# Patient Record
Sex: Male | Born: 1970 | Race: Black or African American | Hispanic: No | Marital: Single
Health system: Southern US, Community
[De-identification: ages and names within clinical notes are randomized; demographics above are authoritative.]

## PROBLEM LIST (undated history)

## (undated) DIAGNOSIS — F32A Depression, unspecified: Secondary | ICD-10-CM

## (undated) DIAGNOSIS — F329 Major depressive disorder, single episode, unspecified: Secondary | ICD-10-CM

## (undated) DIAGNOSIS — E119 Type 2 diabetes mellitus without complications: Secondary | ICD-10-CM

## (undated) HISTORY — PX: NO PAST SURGERIES: SHX2092

---

## 1998-05-09 ENCOUNTER — Emergency Department (HOSPITAL_COMMUNITY): Admission: EM | Admit: 1998-05-09 | Discharge: 1998-05-09 | Payer: Self-pay | Admitting: Emergency Medicine

## 2005-09-30 ENCOUNTER — Emergency Department (HOSPITAL_COMMUNITY): Admission: EM | Admit: 2005-09-30 | Discharge: 2005-10-01 | Payer: Self-pay | Admitting: Emergency Medicine

## 2005-10-01 ENCOUNTER — Ambulatory Visit: Payer: Self-pay | Admitting: Psychiatry

## 2005-10-01 ENCOUNTER — Inpatient Hospital Stay (HOSPITAL_COMMUNITY): Admission: EM | Admit: 2005-10-01 | Discharge: 2005-10-01 | Payer: Self-pay | Admitting: Psychiatry

## 2010-06-02 ENCOUNTER — Emergency Department: Payer: Self-pay | Admitting: Emergency Medicine

## 2010-09-14 ENCOUNTER — Emergency Department: Payer: Self-pay | Admitting: Emergency Medicine

## 2013-06-24 ENCOUNTER — Inpatient Hospital Stay: Payer: Self-pay | Admitting: Internal Medicine

## 2013-06-24 LAB — ETHANOL
Ethanol %: <0.003 % (ref 0.000–0.080)
Ethanol: <3 mg/dL

## 2013-06-24 LAB — COMPREHENSIVE METABOLIC PANEL
Albumin: 4.3 g/dL (ref 3.4–5.0)
Alkaline Phosphatase: 84 U/L (ref 50–136)
Anion Gap: 12 (ref 7–16)
BUN: 28 mg/dL — ABNORMAL HIGH (ref 7–18)
Bilirubin,Total: 1 mg/dL (ref 0.2–1.0)
Calcium, Total: 8.9 mg/dL (ref 8.5–10.1)
Chloride: 100 mmol/L (ref 98–107)
EGFR (African American): >60
Glucose: 314 mg/dL — ABNORMAL HIGH (ref 65–99)
Osmolality: 291 (ref 275–301)
Potassium: 3.8 mmol/L (ref 3.5–5.1)
SGOT(AST): 62 U/L — ABNORMAL HIGH (ref 15–37)
Sodium: 137 mmol/L (ref 136–145)

## 2013-06-24 LAB — BASIC METABOLIC PANEL
Anion Gap: 6 — ABNORMAL LOW (ref 7–16)
BUN: 23 mg/dL — ABNORMAL HIGH (ref 7–18)
Calcium, Total: 8 mg/dL — ABNORMAL LOW (ref 8.5–10.1)
Chloride: 109 mmol/L — ABNORMAL HIGH (ref 98–107)
Co2: 27 mmol/L (ref 21–32)
Creatinine: 1.08 mg/dL (ref 0.60–1.30)
EGFR (African American): >60
Glucose: 189 mg/dL — ABNORMAL HIGH (ref 65–99)
Osmolality: 292 (ref 275–301)
Potassium: 3.8 mmol/L (ref 3.5–5.1)
Sodium: 142 mmol/L (ref 136–145)

## 2013-06-24 LAB — URINALYSIS, COMPLETE
Bacteria: NONE SEEN
Hyaline Cast: 3
Leukocyte Esterase: NEGATIVE
Nitrite: NEGATIVE
Protein: 100
RBC,UR: 11 /HPF (ref 0–5)
Specific Gravity: 1.028 (ref 1.003–1.030)
WBC UR: 4 /HPF (ref 0–5)

## 2013-06-24 LAB — CK TOTAL AND CKMB (NOT AT ARMC)
CK, Total: 1933 U/L — ABNORMAL HIGH (ref 35–232)
CK, Total: 1209 U/L — ABNORMAL HIGH (ref 35–232)
CK-MB: 17.6 ng/mL — ABNORMAL HIGH (ref 0.5–3.6)

## 2013-06-24 LAB — DRUG SCREEN, URINE
Amphetamines, Ur Screen: NEGATIVE (ref ?–1000)
Barbiturates, Ur Screen: NEGATIVE (ref ?–200)
Benzodiazepine, Ur Scrn: NEGATIVE (ref ?–200)
Cannabinoid 50 Ng, Ur ~~LOC~~: POSITIVE (ref ?–50)
MDMA (Ecstasy)Ur Screen: NEGATIVE (ref ?–500)
Methadone, Ur Screen: NEGATIVE (ref ?–300)
Opiate, Ur Screen: NEGATIVE (ref ?–300)
Phencyclidine (PCP) Ur S: NEGATIVE (ref ?–25)
Tricyclic, Ur Screen: NEGATIVE (ref ?–1000)

## 2013-06-24 LAB — CBC
MCH: 31.1 pg (ref 26.0–34.0)
MCV: 90 fL (ref 80–100)
Platelet: 297 10*3/uL (ref 150–440)
RDW: 13.3 % (ref 11.5–14.5)

## 2013-06-25 LAB — CBC WITH DIFFERENTIAL/PLATELET
Eosinophil #: 0.2 10*3/uL (ref 0.0–0.7)
HGB: 12.9 g/dL — ABNORMAL LOW (ref 13.0–18.0)
Lymphocyte #: 2.2 10*3/uL (ref 1.0–3.6)
Lymphocyte %: 32.8 %
MCH: 30.7 pg (ref 26.0–34.0)
MCHC: 34 g/dL (ref 32.0–36.0)
MCV: 90 fL (ref 80–100)
Monocyte %: 7.3 %
Neutrophil #: 3.7 10*3/uL (ref 1.4–6.5)
Neutrophil %: 55.8 %
Platelet: 223 10*3/uL (ref 150–440)
RBC: 4.18 10*6/uL — ABNORMAL LOW (ref 4.40–5.90)

## 2013-06-25 LAB — CK: CK, Total: 630 U/L — ABNORMAL HIGH (ref 35–232)

## 2013-06-25 LAB — BASIC METABOLIC PANEL
Anion Gap: 5 — ABNORMAL LOW (ref 7–16)
Co2: 27 mmol/L (ref 21–32)
EGFR (Non-African Amer.): >60
Glucose: 98 mg/dL (ref 65–99)
Osmolality: 288 (ref 275–301)
Potassium: 3.5 mmol/L (ref 3.5–5.1)
Sodium: 144 mmol/L (ref 136–145)

## 2013-07-14 ENCOUNTER — Ambulatory Visit
Admission: RE | Admit: 2013-07-14 | Discharge: 2013-07-14 | Disposition: A | Discharge status: Home / Self Care | Payer: No Typology Code available for payment source | Source: Ambulatory Visit | Attending: Student | Admitting: Student

## 2013-07-14 ENCOUNTER — Other Ambulatory Visit: Payer: Self-pay | Admitting: Student

## 2013-07-14 DIAGNOSIS — Z9289 Personal history of other medical treatment: Secondary | ICD-10-CM

## 2013-11-04 ENCOUNTER — Emergency Department: Payer: Self-pay | Admitting: Emergency Medicine

## 2013-11-04 LAB — URINALYSIS, COMPLETE
Bacteria: NONE SEEN
Glucose,UR: >=500 mg/dL (ref 0–75)
Leukocyte Esterase: NEGATIVE
Protein: 30
RBC,UR: 3 /HPF (ref 0–5)
Specific Gravity: 1.033 (ref 1.003–1.030)
WBC UR: 6 /HPF (ref 0–5)

## 2013-11-04 LAB — COMPREHENSIVE METABOLIC PANEL
Alkaline Phosphatase: 88 U/L (ref 50–136)
Anion Gap: 8 (ref 7–16)
BUN: 28 mg/dL — ABNORMAL HIGH (ref 7–18)
Calcium, Total: 9.4 mg/dL (ref 8.5–10.1)
Co2: 29 mmol/L (ref 21–32)
Creatinine: 1.37 mg/dL — ABNORMAL HIGH (ref 0.60–1.30)
EGFR (African American): >60
EGFR (Non-African Amer.): >60
Osmolality: 281 (ref 275–301)
Potassium: 3.2 mmol/L — ABNORMAL LOW (ref 3.5–5.1)
SGOT(AST): 34 U/L (ref 15–37)
SGPT (ALT): 23 U/L (ref 12–78)
Sodium: 136 mmol/L (ref 136–145)
Total Protein: 8.2 g/dL (ref 6.4–8.2)

## 2013-11-04 LAB — DRUG SCREEN, URINE
Benzodiazepine, Ur Scrn: NEGATIVE (ref ?–200)
Phencyclidine (PCP) Ur S: NEGATIVE (ref ?–25)
Tricyclic, Ur Screen: NEGATIVE (ref ?–1000)

## 2013-11-04 LAB — CBC
HCT: 46.9 % (ref 40.0–52.0)
HGB: 16.2 g/dL (ref 13.0–18.0)
MCH: 30.4 pg (ref 26.0–34.0)
MCHC: 34.6 g/dL (ref 32.0–36.0)
Platelet: 349 10*3/uL (ref 150–440)
RBC: 5.33 10*6/uL (ref 4.40–5.90)
RDW: 12.6 % (ref 11.5–14.5)
WBC: 15.6 10*3/uL — ABNORMAL HIGH (ref 3.8–10.6)

## 2013-11-04 LAB — ACETAMINOPHEN LEVEL: Acetaminophen: <2 ug/mL

## 2013-11-15 ENCOUNTER — Encounter (HOSPITAL_COMMUNITY): Payer: Self-pay | Admitting: Emergency Medicine

## 2013-11-15 ENCOUNTER — Emergency Department (HOSPITAL_COMMUNITY)
Admission: EM | Admit: 2013-11-15 | Discharge: 2013-11-15 | Disposition: A | Discharge status: Other Acute Inpatient Hospital | Payer: BC Managed Care – PPO | Attending: Emergency Medicine | Admitting: Emergency Medicine

## 2013-11-15 DIAGNOSIS — R45851 Suicidal ideations: Secondary | ICD-10-CM | POA: Insufficient documentation

## 2013-11-15 DIAGNOSIS — Z794 Long term (current) use of insulin: Secondary | ICD-10-CM | POA: Insufficient documentation

## 2013-11-15 DIAGNOSIS — E119 Type 2 diabetes mellitus without complications: Secondary | ICD-10-CM | POA: Insufficient documentation

## 2013-11-15 DIAGNOSIS — F39 Unspecified mood [affective] disorder: Secondary | ICD-10-CM | POA: Insufficient documentation

## 2013-11-15 DIAGNOSIS — F1994 Other psychoactive substance use, unspecified with psychoactive substance-induced mood disorder: Secondary | ICD-10-CM

## 2013-11-15 HISTORY — DX: Type 2 diabetes mellitus without complications: E11.9

## 2013-11-15 LAB — ACETAMINOPHEN LEVEL: Acetaminophen (Tylenol), Serum: <15 ug/mL (ref 10–30)

## 2013-11-15 LAB — CBC
HCT: 41.9 % (ref 39.0–52.0)
Hemoglobin: 14.4 g/dL (ref 13.0–17.0)
MCV: 88.6 fL (ref 78.0–100.0)
RDW: 12.9 % (ref 11.5–15.5)
WBC: 7.5 10*3/uL (ref 4.0–10.5)

## 2013-11-15 LAB — COMPREHENSIVE METABOLIC PANEL
Albumin: 3.9 g/dL (ref 3.5–5.2)
Alkaline Phosphatase: 72 U/L (ref 39–117)
BUN: 18 mg/dL (ref 6–23)
Calcium: 9.5 mg/dL (ref 8.4–10.5)
Chloride: 96 mEq/L (ref 96–112)
Creatinine, Ser: 1.07 mg/dL (ref 0.50–1.35)
GFR calc Af Amer: >90 mL/min (ref >90)
GFR calc non Af Amer: 84 mL/min — ABNORMAL LOW (ref >90)
Glucose, Bld: 150 mg/dL — ABNORMAL HIGH (ref 70–99)
Potassium: 3.8 mEq/L (ref 3.5–5.1)
Sodium: 135 mEq/L (ref 135–145)
Total Bilirubin: 0.3 mg/dL (ref 0.3–1.2)

## 2013-11-15 LAB — GLUCOSE, CAPILLARY: Glucose-Capillary: 258 mg/dL — ABNORMAL HIGH (ref 70–99)

## 2013-11-15 LAB — ETHANOL: Alcohol, Ethyl (B): <11 mg/dL (ref 0–11)

## 2013-11-15 LAB — RAPID URINE DRUG SCREEN, HOSP PERFORMED: Barbiturates: NOT DETECTED

## 2013-11-15 LAB — SALICYLATE LEVEL: Salicylate Lvl: <2 mg/dL — ABNORMAL LOW (ref 2.8–20.0)

## 2013-11-15 MED ORDER — INSULIN GLARGINE 100 UNIT/ML ~~LOC~~ SOLN
14.0000 [IU] | Freq: Every day | SUBCUTANEOUS | Status: DC
  Administered 2013-11-15: 14 [IU] via SUBCUTANEOUS
  Filled 2013-11-15: qty 0.14

## 2013-11-15 MED ORDER — HYDROXYZINE HCL 25 MG PO TABS: 25.0000 mg | ORAL_TABLET | Freq: Four times a day (QID) | ORAL | Status: DC | PRN

## 2013-11-15 MED ORDER — INSULIN ASPART 100 UNIT/ML ~~LOC~~ SOLN: 9.0000 [IU] | Freq: Every day | SUBCUTANEOUS | Status: DC

## 2013-11-15 MED ORDER — INSULIN ASPART 100 UNIT/ML ~~LOC~~ SOLN: 7.0000 [IU] | Freq: Two times a day (BID) | SUBCUTANEOUS | Status: DC

## 2013-11-15 MED ORDER — INSULIN LISPRO 100 UNIT/ML (KWIKPEN): 7.0000 [IU] | PEN_INJECTOR | Freq: Three times a day (TID) | SUBCUTANEOUS | Status: DC

## 2013-11-15 NOTE — ED Notes (Signed)
Patient cooperative. Denies HI, AVH. Reports SI. Contracts for safety. Expresses feelings of hopelessness; anxiety 9/10, depression 10+/10. States that he lost his "soulmate on 5/12" and that he has not worked in three weeks. Reports having no home but states his stepmom and dad are in his life. Patient appeared anxious. Requesting several snacks.   Given Lantus as ordered. Resting quietly.  Safety maintained, Q 15 checks continue.

## 2013-11-15 NOTE — ED Notes (Addendum)
Pt is SI. Pt states that he wants to die, has had a previous attempt in the past. States he just feels hopeless. Has no plan just wants to die. Denies pain.

## 2013-11-15 NOTE — ED Provider Notes (Signed)
CSN: [id number]     Arrival date & time 11/15/13  1833 History   First MD Initiated Contact with Patient 11/15/13 1913     Chief Complaint  Patient presents with  . Medical Clearance   (Consider location/radiation/quality/duration/timing/severity/associated sxs/prior Treatment) HPI Comments: Patient with previous history of depression presents to the ER for worsening depression with helplessness and hopelessness. Patient does report a previous suicide attempt, feels like he is getting close to attempting again. Patient reports that he has multiple stressors. He broke up with his girlfriend in May and has not gotten over her. He also has recently started using crack cocaine again. He lost his job.   Past Medical History  Diagnosis Date  . Diabetes mellitus without complication    History reviewed. No pertinent past surgical history. No family history on file. History  Substance Use Topics  . Smoking status: Unknown If Ever Smoked  . Smokeless tobacco: Not on file  . Alcohol Use: Yes    Review of Systems  Psychiatric/Behavioral: Positive for suicidal ideas and dysphoric mood.  All other systems reviewed and are negative.    Allergies  Review of patient's allergies indicates no known allergies.  Home Medications   Current Outpatient Rx  Name  Route  Sig  Dispense  Refill  . insulin glargine (LANTUS) 100 UNIT/ML injection   Subcutaneous   Inject 14 Units into the skin at bedtime.         . insulin lispro (HUMALOG KWIKPEN) 100 UNIT/ML SOPN   Subcutaneous   Inject 7-9 Units into the skin 3 (three) times daily with meals. 7 units at breakfast 7 units at lunch and 9 units at supper          BP 133/81  Pulse 65  Temp(Src) 98.7 F (37.1 C) (Oral)  Resp 18  SpO2 97% Physical Exam  Constitutional: He is oriented to person, place, and time. He appears well-developed and well-nourished. No distress.  HENT:  Head: Normocephalic and atraumatic.  Right Ear: Hearing normal.   Left Ear: Hearing normal.  Nose: Nose normal.  Mouth/Throat: Oropharynx is clear and moist and mucous membranes are normal.  Eyes: Conjunctivae and EOM are normal. Pupils are equal, round, and reactive to light.  Neck: Normal range of motion. Neck supple.  Cardiovascular: Regular rhythm, S1 normal and S2 normal.  Exam reveals no gallop and no friction rub.   No murmur heard. Pulmonary/Chest: Effort normal and breath sounds normal. No respiratory distress. He exhibits no tenderness.  Abdominal: Soft. Normal appearance and bowel sounds are normal. There is no hepatosplenomegaly. There is no tenderness. There is no rebound, no guarding, no tenderness at McBurney's point and negative Murphy's sign. No hernia.  Musculoskeletal: Normal range of motion.  Neurological: He is alert and oriented to person, place, and time. He has normal strength. No cranial nerve deficit or sensory deficit. Coordination normal. GCS eye subscore is 4. GCS verbal subscore is 5. GCS motor subscore is 6.  Skin: Skin is warm, dry and intact. No rash noted. No cyanosis.  Psychiatric: His speech is normal and behavior is normal. Thought content normal. He exhibits a depressed mood. He expresses no suicidal plans.    ED Course  Procedures (including critical care time) Labs Review Labs Reviewed  ACETAMINOPHEN LEVEL  CBC  COMPREHENSIVE METABOLIC PANEL  ETHANOL  SALICYLATE LEVEL  URINE RAPID DRUG SCREEN (HOSP PERFORMED)   Imaging Review No results found.  EKG Interpretation   None  MDM  Diagnosis: 1. Depression 2. Crack cocaine abuse  Patient with history of depression presents to the ER with worsening depression, helplessness, hopelessness and death wish. He does have previous suicide attempt and he is feeling like he is getting close to attempting once again. He does not have currently a plan, but reports that he is "working out". The patient will require inpatient hospitalization.    Gilda Crease, MD 11/15/13 [phone]

## 2013-11-15 NOTE — BH Assessment (Signed)
Assessment Note  Erik Ray is an 42 y.o. male who presents to [facility] because he was experiencing an "internal debate about ending everything and checking out"  When asked to clarify what he meant by checking out he stated, "ending it all and finding a way to die."  He reports thinking of using carbon monoxide because his research indicates that's the best way and he doesn't own a gun.  He is feeling hopeless that his life situation will improve, he has no job and just lost his girlfriend whom he thinks about "a thousand times a day" and relapsed on crack 3 weeks ago after a 90 day stay at [facility].  He reports two previous attempts by overdose, one in the late 90s and one several years later.  He denies HI or AVH or delusional thoughts.  He is unable to contract for safety.  He endorses feelings of worthlessness, hopelessness, irritability, tearfulness, increased sleep of around 22 hours per day because "when I sleep I don't feel anything."  He is appropriate for inpatient treatment for crisis stabilization.  Axis I: Major Depression, Recurrent severe and Substance Abuse Axis II: Deferred Axis III:  Past Medical History  Diagnosis Date  . Diabetes mellitus without complication    Axis IV: economic problems, housing problems, occupational problems and problems with primary support group Axis V: 31-40 impairment in reality testing  Past Medical History:  Past Medical History  Diagnosis Date  . Diabetes mellitus without complication     History reviewed. No pertinent past surgical history.  Family History: No family history on file.  Social History:  reports that he drinks alcohol. His tobacco and drug histories are not on file.  Additional Social History:  Alcohol / Drug Use History of alcohol / drug use?: Yes Longest period of sobriety (when/how long): 3 weeks Negative Consequences of Use: Financial;Personal relationships Substance #1 Name of Substance 1: Crack 1 - Age  of First Use: 20 1 - Amount (size/oz): 5 grams 1 - Frequency: daily 1 - Duration: 2.5 weeks 1 - Last Use / Amount: 12/1  CIWA: CIWA-Ar BP: 133/81 mmHg Pulse Rate: 65 COWS:    Allergies: No Known Allergies  Home Medications:  (Not in a hospital admission)  OB/GYN Status:  No LMP for male patient.  General Assessment Data Location of Assessment: [facility] ED Is this a Tele or Face-to-Face Assessment?: Face-to-Face Is this an Initial Assessment or a Re-assessment for this encounter?: Initial Assessment Living Arrangements: Alone Can pt return to current living arrangement?: Yes Admission Status: Voluntary Is patient capable of signing voluntary admission?: Yes Transfer from: Acute Hospital Referral Source: Self/Family/Friend     [facility] Crisis Care Plan Living Arrangements: Alone  Education Status Is patient currently in school?: No Highest grade of school patient has completed: bachelor's degree in business  Risk to self Suicidal Ideation: Yes-Currently Present Suicidal Intent: Yes-Currently Present Is patient at risk for suicide?: Yes Suicidal Plan?: Yes-Currently Present Specify Current Suicidal Plan: carbon monoxide Access to Means: Yes Specify Access to Suicidal Means: vehicle What has been your use of drugs/alcohol within the last 12 months?: recent relapse on crack Previous Attempts/Gestures: Yes How many times?: 2 Triggers for Past Attempts: Other personal contacts Intentional Self Injurious Behavior: None Family Suicide History: No Recent stressful life event(s): Conflict (Comment);Loss (Comment);Job Loss;Financial Problems (relapse, lost home, lost job, lost girlfriend) Persecutory voices/beliefs?: No Depression: Yes Depression Symptoms: Despondent;Tearfulness;Isolating;Fatigue;Guilt;Loss of interest in usual pleasures;Feeling worthless/self pity;Feeling angry/irritable;Insomnia Substance abuse history and/or treatment for  substance abuse?: Yes Suicide prevention  information given to non-admitted patients: Not applicable  Risk to Others Homicidal Ideation: No Thoughts of Harm to Others: No Current Homicidal Intent: No Current Homicidal Plan: No Access to Homicidal Means: No History of harm to others?: No Assessment of Violence: None Noted Does patient have access to weapons?: No Criminal Charges Pending?: No Does patient have a court date: No  Psychosis Hallucinations: None noted Delusions: None noted  Mental Status Report Appear/Hygiene:  (unremarkable) Eye Contact: Good Motor Activity: Freedom of movement Speech: Logical/coherent Level of Consciousness: Alert Mood: Depressed Affect: Appropriate to circumstance Anxiety Level: None Thought Processes: Coherent;Relevant Judgement: Impaired Orientation: Person;Place;Time;Situation Obsessive Compulsive Thoughts/Behaviors: None  Cognitive Functioning Concentration: Decreased Memory: Recent Impaired;Remote Intact IQ: Average Insight: Fair Impulse Control: Poor Appetite: Good Weight Loss: 0 Weight Gain: 0 Sleep: Increased Total Hours of Sleep: 22 Vegetative Symptoms: Staying in bed;Decreased grooming  ADLScreening [facility] Assessment Services) Patient's cognitive ability adequate to safely complete daily activities?: Yes Patient able to express need for assistance with ADLs?: Yes Independently performs ADLs?: Yes (appropriate for developmental age)  Prior Inpatient Therapy Prior Inpatient Therapy: Yes Prior Therapy Dates: 1999, mid 2000, 2014 Prior Therapy Facilty/Provider(s): [facility], [facility], [facility] Reason for Treatment: OD, OD, SA  Prior Outpatient Therapy Prior Outpatient Therapy: Yes Prior Therapy Dates: October 2014 Prior Therapy Facilty[facility]Provider(s[facility] Reason for Treatment: SA  ADL Screening (condition at time of admission) Patient's cognitive ability adequate to safely complete daily activities?: Yes Patient  able to express need for assistance with ADLs?: Yes Independently performs ADLs?: Yes (appropriate for developmental age)       Abuse/Neglect Assessment (Assessment to be complete while patient is alone) Physical Abuse: Denies Verbal Abuse: Denies Sexual Abuse: Denies Exploitation of patient/patient's resources: Denies Values / Beliefs Cultural Requests During Hospitalization: None Spiritual Requests During Hospitalization: None   Advance Directives (For Healthcare) Advance Directive: Patient does not have advance directive;Patient would not like information Nutrition Screen- [facility] Adult/[facility] Patient's home diet: Regular  Additional Information 1:1 In Past 12 Months?: No CIRT Risk: No Elopement Risk: No Does patient have medical clearance?: Yes     Disposition:  Disposition Initial Assessment Completed for this Encounter: Yes Disposition of Patient: Inpatient treatment program Type of inpatient treatment program: Adult  On Site Evaluation by:   Reviewed with Physician:    Steward Ros 11/15/2013 9:41 PM

## 2013-11-16 ENCOUNTER — Inpatient Hospital Stay (HOSPITAL_COMMUNITY)
Admission: AD | Admit: 2013-11-16 | Discharge: 2013-11-18 | DRG: 897 | Disposition: A | Discharge status: Home / Self Care | Payer: BC Managed Care – PPO | Source: Intra-hospital | Attending: Psychiatry | Admitting: Psychiatry

## 2013-11-16 ENCOUNTER — Encounter (HOSPITAL_COMMUNITY): Payer: Self-pay | Admitting: *Deleted

## 2013-11-16 DIAGNOSIS — E119 Type 2 diabetes mellitus without complications: Secondary | ICD-10-CM | POA: Diagnosis present

## 2013-11-16 DIAGNOSIS — F1420 Cocaine dependence, uncomplicated: Principal | ICD-10-CM | POA: Diagnosis present

## 2013-11-16 DIAGNOSIS — F1994 Other psychoactive substance use, unspecified with psychoactive substance-induced mood disorder: Secondary | ICD-10-CM | POA: Diagnosis present

## 2013-11-16 DIAGNOSIS — Z79899 Other long term (current) drug therapy: Secondary | ICD-10-CM

## 2013-11-16 DIAGNOSIS — R45851 Suicidal ideations: Secondary | ICD-10-CM

## 2013-11-16 DIAGNOSIS — F322 Major depressive disorder, single episode, severe without psychotic features: Secondary | ICD-10-CM

## 2013-11-16 DIAGNOSIS — F1410 Cocaine abuse, uncomplicated: Secondary | ICD-10-CM

## 2013-11-16 LAB — GLUCOSE, CAPILLARY
Glucose-Capillary: 169 mg/dL — ABNORMAL HIGH (ref 70–99)
Glucose-Capillary: 177 mg/dL — ABNORMAL HIGH (ref 70–99)
Glucose-Capillary: 146 mg/dL — ABNORMAL HIGH (ref 70–99)

## 2013-11-16 MED ORDER — INSULIN ASPART 100 UNIT/ML ~~LOC~~ SOLN
0.0000 [IU] | Freq: Every day | SUBCUTANEOUS | Status: DC
  Administered 2013-11-17: 3 [IU] via SUBCUTANEOUS

## 2013-11-16 MED ORDER — INSULIN ASPART 100 UNIT/ML ~~LOC~~ SOLN
0.0000 [IU] | Freq: Three times a day (TID) | SUBCUTANEOUS | Status: DC
  Administered 2013-11-16: 3 [IU] via SUBCUTANEOUS
  Administered 2013-11-16: 2 [IU] via SUBCUTANEOUS
  Administered 2013-11-17: 3 [IU] via SUBCUTANEOUS
  Administered 2013-11-18: 2 [IU] via SUBCUTANEOUS

## 2013-11-16 MED ORDER — INSULIN GLARGINE 100 UNIT/ML ~~LOC~~ SOLN
14.0000 [IU] | Freq: Every day | SUBCUTANEOUS | Status: DC
  Administered 2013-11-16 – 2013-11-17 (×2): 14 [IU] via SUBCUTANEOUS

## 2013-11-16 MED ORDER — TRAZODONE HCL 50 MG PO TABS
50.0000 mg | ORAL_TABLET | Freq: Every evening | ORAL | Status: DC | PRN
  Administered 2013-11-16 – 2013-11-17 (×2): 50 mg via ORAL
  Filled 2013-11-16: qty 28
  Filled 2013-11-16 (×2): qty 1
  Filled 2013-11-16: qty 28
  Filled 2013-11-16 (×4): qty 1

## 2013-11-16 MED ORDER — MAGNESIUM HYDROXIDE 400 MG/5ML PO SUSP: 30.0000 mL | Freq: Every day | ORAL | Status: DC | PRN

## 2013-11-16 MED ORDER — ACETAMINOPHEN 325 MG PO TABS: 650.0000 mg | ORAL_TABLET | Freq: Four times a day (QID) | ORAL | Status: DC | PRN

## 2013-11-16 MED ORDER — NICOTINE POLACRILEX 2 MG MT GUM
2.0000 mg | CHEWING_GUM | OROMUCOSAL | Status: DC | PRN
  Administered 2013-11-17: 2 mg via ORAL
  Filled 2013-11-16 (×2): qty 1

## 2013-11-16 MED ORDER — ALUM & MAG HYDROXIDE-SIMETH 200-200-20 MG/5ML PO SUSP: 30.0000 mL | ORAL | Status: DC | PRN

## 2013-11-16 NOTE — Tx Team (Signed)
Interdisciplinary Treatment Plan Update (Adult)  Date: 11/16/2013   Time Reviewed: 10:40 AM  Progress in Treatment:  Attending groups: No.  Participating in groups:  No.  Taking medication as prescribed: Yes  Tolerating medication: Yes  Family/Significant othe contact made: Not yet. SPE required for this pt.  Patient understands diagnosis: Yes, AEB seeking treatment for crack cocaine abuse, SI with plan, and mood stabilization.  Discussing patient identified problems/goals with staff: Yes  Medical problems stabilized or resolved: Yes  Denies suicidal/homicidal ideation: passive SI; able to contract for safety on the unit.  Patient has not harmed self or Others: Yes  New problem(s) identified:  Discharge Plan or Barriers: Pt currently not attending d/c planning group. CSW assessing for appropriate referrals.  Additional comments:Erik Ray is an 42 y.o. male who presents to [facility] because he was experiencing an "internal debate about ending everything and checking out" When asked to clarify what he meant by checking out he stated, "ending it all and finding a way to die." He reports thinking of using carbon monoxide because his research indicates that's the best way and he doesn't own a gun. He is feeling hopeless that his life situation will improve, he has no job and just lost his girlfriend whom he thinks about "a thousand times a day" and relapsed on crack 3 weeks ago after a 90 day stay at [facility]. He reports two previous attempts by overdose, one in the late 90s and one several years later. He denies HI or AVH or delusional thoughts. He is unable to contract for safety. He endorses feelings of worthlessness, hopelessness, irritability, tearfulness, increased sleep of around 22 hours per day because "when I sleep I don't feel anything." He is appropriate for inpatient treatment for crisis stabilization. Reason for Continuation of Hospitalization: Mood  stabilization SI Medication management  Estimated length of stay: 4-6 days  For review of initial/current patient goals, please see plan of care.  Attendees:  Patient:    Family:    Physician: Geoffery Lyons MD 11/16/2013 10:40 AM   Nursing: Maureen Ralphs RN 11/16/2013 10:40 AM   Clinical Social Worker Zurri Rudden Smart, LCSWA  11/16/2013 10:40 AM   Other: Darden Dates Nurse CM  11/16/2013 10:40 AM   Other:    Other:    Other:    Scribe for Treatment Team:  Trula Slade LCSWA  11/16/2013 10:40 AM

## 2013-11-16 NOTE — Progress Notes (Signed)
Adult Psychoeducational Group Note  Date:  11/16/2013 Time:  9:56 PM  Pt did not attend NA meeting. When encouraged by writer he stated that he would rather stay sleeping  Halimah Bewick 11/16/2013, 9:56 PM

## 2013-11-16 NOTE — Progress Notes (Signed)
NUTRITION ASSESSMENT  Pt identified as at risk on the Malnutrition Screen Tool  INTERVENTION: 1. Educated patient on the importance of nutrition and encouraged intake of food and beverages.  NUTRITION DIAGNOSIS: Unintentional weight loss related to sub-optimal intake as evidenced by pt report.   Goal: Pt to meet >/= 90% of their estimated nutrition needs.  Monitor:  PO intake  Assessment:  Patient admitted with MDD, crack cocaine use, and SI.  Hx includes DM.  Patient lost job.  Patient reports UBW of 160 lbs with weight loss secondary to smoking crack.  Currently eating well with no complaints.  Patient reports following diabetic diet and has no questions at this time.    42 y.o. male  Height: Ht Readings from Last 1 Encounters:  11/16/13 5\' 6"  (1.676 m)    Weight: Wt Readings from Last 1 Encounters:  11/16/13 143 lb 8 oz (65.091 kg)    Weight Hx: Wt Readings from Last 10 Encounters:  11/16/13 143 lb 8 oz (65.091 kg)    BMI:  Body mass index is 23.17 kg/(m^2). Pt meets criteria for wnl based on current BMI.  Estimated Nutritional Needs: Kcal: 25-30 kcal/kg Protein: > 1 gram protein/kg Fluid: 1 ml/kcal  Diet Order: General Pt is also offered choice of unit snacks mid-morning and mid-afternoon.  Pt is eating as desired.   Lab results and medications reviewed.   Oran Rein, RD, LDN Clinical Inpatient Dietitian Pager:  [phone] Weekend and after hours pager:  [phone]

## 2013-11-16 NOTE — BHH Group Notes (Signed)
[facility] LCSW Aftercare Discharge Planning Group Note   11/16/2013 10:01 AM  Participation Quality:  DID NOT ATTEND    Smart, Lebron Quam

## 2013-11-16 NOTE — BHH Group Notes (Signed)
[facility] LCSW Group Therapy  11/16/2013 3:26 PM  Type of Therapy:  Group Therapy  Participation Level:  Did Not Attend  SmartLebron Quam 11/16/2013, 3:26 PM

## 2013-11-16 NOTE — BHH Suicide Risk Assessment (Signed)
Suicide Risk Assessment  Admission Assessment     Nursing information obtained from:  Patient Demographic factors:  Male;Caucasian;Low socioeconomic status;Living alone;Unemployed Current Mental Status:  NA Loss Factors:  Loss of significant relationship;Financial problems / change in socioeconomic status Historical Factors:  Prior suicide attempts;Impulsivity Risk Reduction Factors:  Responsible for children under 42 years of age;Sense of responsibility to family;Religious beliefs about death  CLINICAL FACTORS:   Depression:   Anhedonia Comorbid alcohol abuse/dependence Hopelessness Insomnia Severe Alcohol/Substance Abuse/Dependencies  COGNITIVE FEATURES THAT CONTRIBUTE TO RISK:  Closed-mindedness Polarized thinking Thought constriction (tunnel vision)    SUICIDE RISK:   Moderate:  Frequent suicidal ideation with limited intensity, and duration, some specificity in terms of plans, no associated intent, good self-control, limited dysphoria/symptomatology, some risk factors present, and identifiable protective factors, including available and accessible social support.  PLAN OF CARE: Supportive approach/coping skills/relapse prevention                               Reassess and address the co morbidities  I certify that inpatient services furnished can reasonably be expected to improve the patient's condition.  Darrin Apodaca A 11/16/2013, 12:44 PM
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
## 2013-11-16 NOTE — Tx Team (Signed)
Initial Interdisciplinary Treatment Plan  PATIENT STRENGTHS: (choose at least two) Ability for insight Active sense of humor Average or above average intelligence Capable of independent living Communication skills General fund of knowledge Physical Health Religious Affiliation Work skills  PATIENT STRESSORS: Financial difficulties Loss of girlfriend in May 2012 Substance abuse   PROBLEM LIST: Problem List/Patient Goals Date to be addressed Date deferred Reason deferred Estimated date of resolution  "Give me hope" 11/16/13     Depression 11/16/13     Substance abuse 11/16/13     Increased risk for suicide 11/16/13                                    DISCHARGE CRITERIA:  Ability to meet basic life and health needs Adequate post-discharge living arrangements Improved stabilization in mood, thinking, and/or behavior Motivation to continue treatment in a less acute level of care Need for constant or close observation no longer present Reduction of life-threatening or endangering symptoms to within safe limits Safe-care adequate arrangements made Verbal commitment to aftercare and medication compliance Withdrawal symptoms are absent or subacute and managed without 24-hour nursing intervention  PRELIMINARY DISCHARGE PLAN: Attend 12-step recovery group Outpatient therapy Participate in family therapy Placement in alternative living arrangements  PATIENT/FAMIILY INVOLVEMENT: This treatment plan has been presented to and reviewed with the patient, Erik Ray, and/or family member.  The patient and family have been given the opportunity to ask questions and make suggestions.  Erik Ray 11/16/2013, 12:54 AM

## 2013-11-16 NOTE — Progress Notes (Signed)
Patient ID: Erik Ray, male   DOB: 1971-05-26, 42 y.o.   MRN: [id number]   Pt was cooperative,but anxious and slightly irritable during the assessment. Pt didn't take the time to read any of his paperwork and when the writer explained he repeated, "I've been to places like this before". Pt was sarcastic and short with the writer, often laughing when informed of policy. When asked the circumstances surrounding his adm pt stated "I wanna keep from blowing my head off". Pt reported that he came because of his step mother. Pt's girlfriend died in 2013/05/17. When asked if he had any questions or concerns , pt stated "can you get me some cocaine". Pt wouldn't allow his blood sugar to be taken, stating that it doesn't get taken every two hours. Pt denied SI, HI, and A/v.
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
## 2013-11-17 DIAGNOSIS — F39 Unspecified mood [affective] disorder: Secondary | ICD-10-CM

## 2013-11-17 LAB — GLUCOSE, CAPILLARY
Glucose-Capillary: 88 mg/dL (ref 70–99)
Glucose-Capillary: 199 mg/dL — ABNORMAL HIGH (ref 70–99)

## 2013-11-17 NOTE — Progress Notes (Addendum)
Patient ID: Erik Ray, male   DOB: [birth year], 42 y.o.   MRN: [id number] Pt did not attend the 10am AA meeting. He was asleep in bed.

## 2013-11-17 NOTE — Progress Notes (Signed)
Patient did not attend the evening karaoke group. Pt returned to his room and went to bed as group was beginning. Pt was upset that he had no other option than to attend karaoke. Pt was explained to that it was a group like all others and all pts were encouraged to attend.

## 2013-11-17 NOTE — Progress Notes (Signed)
Recreation Therapy Notes  Date: 12.04.2014 Time: 3:00pm Location: [address] Dayroom   Group Topic: Communication, Team Building, Problem Solving  Goal Area(s) Addresses:  Patient will effectively work with peer towards shared goal.  Patient will identify skill used to make activity successful.  Patient will identify how skills used during activity can be used to reach post d/c goals.   Behavioral Response: Did not attend   Erik Ray L Erik Ray, LRT/CTRS  Gregary Blackard L 11/17/2013 3:52 PM 

## 2013-11-17 NOTE — Progress Notes (Signed)
D:  Patient up much of the evening.  Active in the milieu.  Up in dayroom, but did not attend Karaoke this evening.  Appetite is good.  He denies thoughts of suicide at this time.  A:  Medications given as ordered.  Encouraged patient to be up and attending groups.  Educated about medications.   R:  Pleasant and cooperative with staff and peers.  No obvious signs or symptoms of withdrawal noted at this time.  Safety is maintained.

## 2013-11-17 NOTE — Progress Notes (Signed)
[facility] MD Progress Note  11/17/2013 5:22 PM Erik Ray  MRN:  [id number] Subjective:  Isolating. States he wants to go to the [facility]. His family is willing to take him there. He has been avoidant, evidencing mood instability, episodes of irritability. He sates he wants to get his life back together, and be completely off cocaine Diagnosis:   DSM5: Schizophrenia Disorders:  none Obsessive-Compulsive Disorders:  none Trauma-Stressor Disorders:  none Substance/Addictive Disorders:  Cocaine related disorders Depressive Disorders:  none  Axis I: Mood Disorder NOS and Substance Induced Mood Disorder  ADL's:  Intact  Sleep: Fair  Appetite:  Fair  Suicidal Ideation:  Plan:  denies Intent:  denies Means:  denies Homicidal Ideation:  Plan:  denies Intent:  denies Means:  denies AEB (as evidenced by):  Psychiatric Specialty Exam: Review of Systems  Constitutional: Negative.   HENT: Negative.   Eyes: Negative.   Respiratory: Negative.   Cardiovascular: Negative.   Gastrointestinal: Negative.   Genitourinary: Negative.   Musculoskeletal: Negative.   Skin: Negative.   Neurological: Negative.   Endo/Heme/Allergies: Negative.   Psychiatric/Behavioral: Positive for substance abuse. The patient is nervous/anxious.     Blood pressure 108/70, pulse 63, temperature 97.7 F (36.5 C), temperature source Oral, resp. rate 16, height 5\' 6"  (1.676 m), weight 65.091 kg (143 lb 8 oz).Body mass index is 23.17 kg/(m^2).  General Appearance: Disheveled  Eye Solicitor::  Fair  Speech:  Clear and Coherent  Volume:  Decreased  Mood:  Anxious and Irritable  Affect:  Restricted  Thought Process:  Coherent and Goal Directed  Orientation:  Full (Time, Place, and Person)  Thought Content:  worries, concerns  Suicidal Thoughts:  No  Homicidal Thoughts:  No  Memory:  Immediate;   Fair Recent;   Fair Remote;   Fair  Judgement:  Fair  Insight:  Present and superficial  Psychomotor  Activity:  Restlessness  Concentration:  Fair  Recall:  Fair  Akathisia:  NA  Handed:    AIMS (if indicated):     Assets:  Desire for Improvement  Sleep:  Number of Hours: 6   Current Medications: Current Facility-Administered Medications  Medication Dose Route Frequency Provider Last Rate Last Dose  . acetaminophen (TYLENOL) tablet 650 mg  650 mg Oral Q6H PRN Kerry Hough, PA-C      . alum & mag hydroxide-simeth (MAALOX/MYLANTA) 200-200-20 MG/5ML suspension 30 mL  30 mL Oral Q4H PRN Kerry Hough, PA-C      . insulin aspart (novoLOG) injection 0-15 Units  0-15 Units Subcutaneous TID WC Kerry Hough, PA-C   3 Units at 11/17/13 1717  . insulin aspart (novoLOG) injection 0-5 Units  0-5 Units Subcutaneous QHS Spencer E Simon, PA-C      . insulin glargine (LANTUS) injection 14 Units  14 Units Subcutaneous QHS Kerry Hough, PA-C   14 Units at 11/16/13 2310  . magnesium hydroxide (MILK OF MAGNESIA) suspension 30 mL  30 mL Oral Daily PRN Kerry Hough, PA-C      . nicotine polacrilex (NICORETTE) gum 2 mg  2 mg Oral PRN Rachael Fee, MD   2 mg at 11/17/13 1509  . traZODone (DESYREL) tablet 50 mg  50 mg Oral QHS,MR X 1 Kerry Hough, PA-C   50 mg at 11/16/13 2313    Lab Results:  Results for orders placed during the hospital encounter of 11/16/13 (from the past 48 hour(s))  GLUCOSE, CAPILLARY     Status: Abnormal  Collection Time    11/16/13  6:15 AM      Result Value Range   Glucose-Capillary 148 (*) 70 - 99 mg/dL  HEMOGLOBIN Z6X     Status: Abnormal   Collection Time    11/16/13  6:40 AM      Result Value Range   Hemoglobin A1C 6.4 (*) <5.7 %   Comment: (NOTE)                                                                               According to the ADA Clinical Practice Recommendations for 2011, when     HbA1c is used as a screening test:      >=6.5%   Diagnostic of Diabetes Mellitus               (if abnormal result is confirmed)     5.7-6.4%   Increased risk  of developing Diabetes Mellitus     References:Diagnosis and Classification of Diabetes Mellitus,Diabetes     Care,2011,34(Suppl 1):S62-S69 and Standards of Medical Care in             Diabetes - 2011,Diabetes Care,2011,34 (Suppl 1):S11-S61.   Mean Plasma Glucose 137 (*) <117 mg/dL   Comment: Performed at [facility]  GLUCOSE, CAPILLARY     Status: Abnormal   Collection Time    11/16/13 11:56 AM      Result Value Range   Glucose-Capillary 169 (*) 70 - 99 mg/dL   Comment 1 Notify RN    GLUCOSE, CAPILLARY     Status: Abnormal   Collection Time    11/16/13  5:07 PM      Result Value Range   Glucose-Capillary 177 (*) 70 - 99 mg/dL  GLUCOSE, CAPILLARY     Status: Abnormal   Collection Time    11/16/13  8:55 PM      Result Value Range   Glucose-Capillary 146 (*) 70 - 99 mg/dL   Comment 1 Notify RN    GLUCOSE, CAPILLARY     Status: None   Collection Time    11/17/13  6:23 AM      Result Value Range   Glucose-Capillary 88  70 - 99 mg/dL   Comment 1 Notify RN    GLUCOSE, CAPILLARY     Status: Abnormal   Collection Time    11/17/13 11:57 AM      Result Value Range   Glucose-Capillary 104 (*) 70 - 99 mg/dL  GLUCOSE, CAPILLARY     Status: Abnormal   Collection Time    11/17/13  5:14 PM      Result Value Range   Glucose-Capillary 199 (*) 70 - 99 mg/dL    Physical Findings: AIMS: Facial and Oral Movements Muscles of Facial Expression: None, normal Lips and Perioral Area: None, normal Jaw: None, normal Tongue: None, normal,Extremity Movements Upper (arms, wrists, hands, fingers): None, normal Lower (legs, knees, ankles, toes): None, normal, Trunk Movements Neck, shoulders, hips: None, normal, Overall Severity Severity of abnormal movements (highest score from questions above): None, normal Incapacitation due to abnormal movements: None, normal Patient's awareness of abnormal movements (rate only patient's report): No Awareness, Dental Status Current problems with teeth  and/or  dentures?: No Does patient usually wear dentures?: No  CIWA:  CIWA-Ar Total: 5 COWS:     Treatment Plan Summary: Daily contact with patient to assess and evaluate symptoms and progress in treatment Medication management  Plan: Supportive approach/coping skills/relapse prevention           Reassess co morbidities           Consider a mood stabilizer  Medical Decision Making Problem Points:  Review of psycho-social stressors (1) Data Points:  Review of medication regiment & side effects (2)  I certify that inpatient services furnished can reasonably be expected to improve the patient's condition.   Kentavious Michele A 11/17/2013, 5:22 PM

## 2013-11-17 NOTE — BHH Counselor (Signed)
Adult Comprehensive Assessment  Patient ID: Erik Ray, male   DOB: [birth year], 42 y.o.   MRN: [id number]  Information Source: Information source: Patient  Current Stressors:  Educational / Learning stressors: B.S. in business from [employer] in [place], [place] Employment / Job issues: unemployed as of three weeks ago. 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 Family Relationships: strained relationship with father. close to stepmother. Close to 58 year old daughter. Mother deceased. Recent breakup with girlfriend. Financial / Lack of resources (include bankruptcy): no income/no insurance. assistance from family Housing / Lack of housing: My dad's house. They will help me get to [facility].  Physical health (include injuries & life threatening diseases): none identified Social relationships: limited-Few close friends/strained family relationships due to recent relapse. Substance abuse: relapsed on crack cocaine three weeks ago-had four months clean. No specific daily amount specified by pt. No other drug or alcohol use reported by pt. Bereavement / Loss: none identified-deaths. Mother passed away 21 years ago. relationship with girlfriend ended "recently."   Living/Environment/Situation:  Living Arrangements: Parent Living conditions (as described by patient or guardian): Good, supportive, loving How long has patient lived in current situation?: few months  What is atmosphere in current home: Comfortable;Loving;Supportive  Family History:  Marital status: Single Does patient have children?: Yes How many children?: 1 How is patient's relationship with their children?: 8 year old child. "we are pretty close."   Childhood History:  By whom was/is the patient raised?: Both parents Additional childhood history information: My mom died 21 years ago. They were married throughout my childhood. It was a good childhood. Description of  patient's relationship with caregiver when they were a child: "close to mother and father as a child (equally)" Patient's description of current relationship with people who raised him/her: mother deceased; strained relationship with father and stepmother since relapse. "my stepmom is very supportive and pushed me to get help."  Does patient have siblings?: No Did patient suffer any verbal/emotional/physical/sexual abuse as a child?: No Did patient suffer from severe childhood neglect?: No Has patient ever been sexually abused/assaulted/raped as an adolescent or adult?: No Was the patient ever a victim of a crime or a disaster?: No Witnessed domestic violence?: No Has patient been effected by domestic violence as an adult?: No  Education:  Highest grade of school patient has completed: B.S. in Busini[employer]  Currently a student?: No Learning disability?: No  Employment/Work Situation:   Employment situation: Unemployed Patient's job has been impacted by current illness: Yes Describe how patient's job has been impacted: I relapsed on crack cocaine three weeks ago until relapse. My dad might let me start back after I go to treatment.  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  Has patient ever been in the [employer]?: No Has patient ever served in combat?: No  Financial Resources:   Financial resources: Support from parents / caregiver Does patient have a Lawyer or guardian?: No  Alcohol/Substance Abuse:   What has been your use of drugs/alcohol within the last 12 months?: "I don't know how much I've used daily" Crack cocaine relapse three weeks ago after four months of clean time. No other drug or alcohol abuse identified.  If attempted suicide, did drugs/alcohol play a role in this?: No (no thoughts or past attempts reported by pt.  )  Alcohol/Substance Abuse Treatment Hx: Past Tx, Inpatient;Past Tx, Outpatient If yes, describe treatment: [facility] inpatient 2014; currently [facility].  Has alcohol/substance abuse ever caused legal problems?: No  Social Support System:   Patient's Community Support System: Fair Describe Community Support System: I have some close friends but not many.  Type of faith/religion: n/a  How does patient's faith help to cope with current illness?: n/a   Leisure/Recreation:   Leisure and Hobbies: "I dont' have any hobbies."  Strengths/Needs:   What things does the patient do well?: "I can't think of anything right now."  In what areas does patient struggle / problems for patient: coping with stress; my addiction; family relationships.   Discharge Plan:   Does patient have access to transportation?: Yes (stepmom) Will patient be returning to same living situation after discharge?: Yes (home with family until he is accepted into [facility]. ) Currently receiving community mental health services: No If no, would patient like referral for services when discharged?: No (Pt refusing to sign consent for [facility] and states that he will arrange his own follow-up. Pt refusing referral for services by CSW.) Does patient have financial barriers related to discharge medications?: Yes Patient description of barriers related to discharge medications: no insurance at this time.   Summary/Recommendations:    Pt is 42 year old male living in [place], [place] with his father and stepmother. Pt presents to [facility] for substance abuse (crack cocaine), SI with plan, and mood stabilization. Pt stated that he had four months clean but assessment note indicates that he left [facility] in mid October and relapsed two weeks later. Pt poor historian and labile in mood/refusing to answer some questions or provide detail. Pt  Denies SI/HI/AVH at this time. Recommendations for pt include:  crisis stabilization, therapeutic milieu, encourage group attendance and participation, medication management for mood stabilization, and development of comprehensive mental wellness/sobriety plan. Pt currently refusing to sign consents (roi) or release for [facility]. Pt stated that he can work on his aftercare plan himself and is refusing all assistance from CSW at this time. SPE completed with pt as he refused family contact.   Smart, Macdona LCSWA 11/17/2013

## 2013-11-17 NOTE — BHH Group Notes (Signed)
BHH LCSW Group Therapy  11/17/2013 1:15 PM   Type of Therapy:  Group Therapy  Participation Level:  Did Not Attend - pt was asleep in their room for the duration of group   Reyes Ivan, LCSW 11/17/2013 2:12 PM

## 2013-11-17 NOTE — Progress Notes (Signed)
B[facility] Group Notes:  (Nursing/MHT/Case Management/Adjunct)  Date:  11/17/2013  Time:  9:46 AM  Type of Therapy:  Nurse Education  Participation Level:  Did Not Attend  Summary of Progress/Problems: Pt was encouraged to come to group but pt stayed in bed.   Cathlean Cower 11/17/2013, 9:46 AM

## 2013-11-17 NOTE — Progress Notes (Signed)
D: Patient resting in bed with eyes closed.  Respirations even and unlabored.  Patient appears to be in no apparent distress. A: Staff to monitor Q 15 mins for safety.   R:Patient remains safe on the unit.  

## 2013-11-17 NOTE — BHH Suicide Risk Assessment (Signed)
BHH INPATIENT:  Family/Significant Other Suicide Prevention Education  Suicide Prevention Education:  Patient Refusal for Family/Significant Other Suicide Prevention Education: The patient Erik Ray has refused to provide written consent for family/significant other to be provided Family/Significant Other Suicide Prevention Education during admission and/or prior to discharge.  Physician notified.   SPE completed with pt. SPI pamphlet provided to pt and he was encouraged to share information with his support network, ask questions, and talk about any concerns.   Smart, Zykera Abella LCSWA  11/17/2013, 11:25 AM

## 2013-11-18 DIAGNOSIS — F1420 Cocaine dependence, uncomplicated: Principal | ICD-10-CM

## 2013-11-18 LAB — GLUCOSE, CAPILLARY: Glucose-Capillary: 112 mg/dL — ABNORMAL HIGH (ref 70–99)

## 2013-11-18 MED ORDER — INSULIN LISPRO 100 UNIT/ML (KWIKPEN): 7.0000 [IU] | PEN_INJECTOR | Freq: Three times a day (TID) | SUBCUTANEOUS | Status: DC

## 2013-11-18 MED ORDER — INSULIN GLARGINE 100 UNIT/ML ~~LOC~~ SOLN: 14.0000 [IU] | Freq: Every day | SUBCUTANEOUS | Status: DC

## 2013-11-18 MED ORDER — TRAZODONE HCL 50 MG PO TABS: 50.0000 mg | ORAL_TABLET | Freq: Every evening | ORAL | Status: DC | PRN

## 2013-11-18 NOTE — BHH Suicide Risk Assessment (Signed)
Suicide Risk Assessment  Discharge Assessment     Demographic Factors:  Male and Caucasian  Mental Status Per Nursing Assessment::   On Admission:  NA  Current Mental Status by Physician: In full contact with reality. There are no suicidal ideas, plans or intent. His mood is euthymic, his affect is appropriate. He is going to be picked up by family and will go to the [facility] residential treatment program. States that what went wrong the last time is that he got back with people he should not have been with.  Wants to regain his sobriety back   Loss Factors: NA  Historical Factors: NA  Risk Reduction Factors:   Employed and Positive social support  Continued Clinical Symptoms:  Alcohol/Substance Abuse/Dependencies  Cognitive Features That Contribute To Risk:  Closed-mindedness Polarized thinking Thought constriction (tunnel vision)    Suicide Risk:  Minimal: No identifiable suicidal ideation.  Patients presenting with no risk factors but with morbid ruminations; may be classified as minimal risk based on the severity of the depressive symptoms  Discharge Diagnoses:   AXIS I:  Cocaine Dependence, Substance Induced Mood Disorder AXIS II:  Deferred AXIS III:   Past Medical History  Diagnosis Date  . Diabetes mellitus without complication    AXIS IV:  other psychosocial or environmental problems AXIS V:  61-70 mild symptoms  Plan Of Care/Follow-up recommendations:  Activity:  as tolerated Diet:  regular Follow up [facility] Is patient on multiple antipsychotic therapies at discharge:  No   Has Patient had three or more failed trials of antipsychotic monotherapy by history:  No  Recommended Plan for Multiple Antipsychotic Therapies: NA  Erik Ray A 11/18/2013, 12:26 PM

## 2013-11-18 NOTE — Tx Team (Signed)
Interdisciplinary Treatment Plan Update (Adult)  Date: 11/18/2013  Time Reviewed:  9:45 AM  Progress in Treatment: Attending groups: Yes Participating in groups:  Yes Taking medication as prescribed:  Yes Tolerating medication:  Yes Family/Significant othe contact made: Yes Patient understands diagnosis:  Yes Discussing patient identified problems/goals with staff:  Yes Medical problems stabilized or resolved:  Yes Denies suicidal/homicidal ideation: Yes Issues/concerns per patient self-inventory:  Yes Other:  New problem(s) identified: N/A  Discharge Plan or Barriers: Pt refused CSW to schedule follow up and states that he will arrange his own follow up.    Reason for Continuation of Hospitalization: Stable to d/c today  Comments: N/A  Estimated length of stay: D/C today  For review of initial/current patient goals, please see plan of care.  Attendees: Patient:     Family:     Physician:  Dr. Dub Mikes 11/18/2013 9:55 AM   Nursing:   Enzo Bi, RN 11/18/2013 9:55 AM   Clinical Social Worker:  Reyes Ivan, LCSW 11/18/2013 9:55 AM   Other: Onnie Boer, RN case manager 11/18/2013 9:55 AM   Other:  Trula Slade, LCSWA 11/18/2013 9:55 AM   Other:     Other:     Other:    Other:    Other:    Other:    Other:    Other:     Scribe for Treatment Team:   Carmina Miller, 11/18/2013 , 9:55 AM

## 2013-11-18 NOTE — BHH Group Notes (Signed)
BHH LCSW Aftercare Discharge Planning Group Note   11/18/2013  8:45 AM  Participation Quality:  Did Not Attend - pt sleeping in his room  Saagar Tortorella Horton, LCSW 11/18/2013 10:33 AM       

## 2013-11-18 NOTE — Discharge Summary (Signed)
Physician Discharge Summary Note  Patient:  Erik Ray is an 42 y.o., male MRN:  [id number] DOB:  [birth year] Patient phone:  [phone] (home)  Patient address:   [address] [place] [address],   Date of Admission:  11/16/2013 Date of Discharge: 11/18/13  Reason for Admission: Cocaine dependence  Discharge Diagnoses: Active Problems:   Substance induced mood disorder   Cocaine dependence  Review of Systems  Constitutional: Negative.   HENT: Negative.   Eyes: Negative.   Respiratory: Negative.   Cardiovascular: Negative.   Gastrointestinal: Negative.   Genitourinary: Negative.   Musculoskeletal: Negative.   Skin: Negative.   Neurological: Negative.   Endo/Heme/Allergies: Negative.   Psychiatric/Behavioral: Positive for substance abuse (Cocaine dependence). Negative for depression, suicidal ideas, hallucinations and memory loss. The patient has insomnia (Stabilized with medication prior to discharge). The patient is not nervous/anxious.    DSM5: Schizophrenia Disorders:  NA Obsessive-Compulsive Disorders:  NA Trauma-Stressor Disorders:  NA Substance/Addictive Disorders:  Cocaine dependence Depressive Disorders:  Substance induced mood disorder  Axis Diagnosis:  AXIS I:  Cocaine dependence, Substance induced mood disorder AXIS II:  Deferred AXIS III:   Past Medical History  Diagnosis Date  . Diabetes mellitus without complication    AXIS IV:  other psychosocial or environmental problems and Cocaine dependence AXIS V:  63  Level of Care:  OP  Hospital Course: 42 Y/O male who came requesting help after he relapsed on crack cocaine. He has been using on and off since he was 42 Y/O. More recently he wen to [facility] for 90 days extended stay. He got out Oct 17 and relapsed on crack 2 weeks later. Has been using every day. Admits to depression with plans to kill himself by using carbon monoxide. States he has lost everything.  While a patient in this  hospital and after admission assessment/evaluation, it was determined based on patient's symptoms that he will need medication management to stabilize her current major depressive mood symptoms. His UDS was only positive for cocaine. As it known that there have not been any established detoxification treatment protocols for cocaine. Therefore, Mr. Nanna did not receive any detoxification treatment protocols. However, he was ordered and received Trazodone 50 mg Q bedtime for sleep. He also was enrolled in group counseling sessions and activities where he was taught, counseled and learned coping skills that should help him cope better and manage his symptoms effectively after discharge. He also received medication management and or monitoring for his other previously existing medical issues and concerns that he presented. He was monitored closely for any potential problems that may arise as a result of his treatments. He tolerated his treatment regimen without any significant adverse effects and or reactions presented.   Patient did respond positively to his treatment regimen. This is evidenced by his daily reports of improved mood, reduction of symptoms and presentation of good affect/eye contact. He attended treatment team meeting this am and met with his treatment team members. His reason for admission, present symptoms, response to treatment and discharge plans discussed with patient. Mr.Rettke endorsed that his symptoms has stabilized and that he is ready for discharge to pursue psychiatric care on outpatient basis. However, he declined to have a follow-up visit scheduled. He has been instructed that in the event of worsening symptoms, patient is instructed to call the crisis hotline, 911 and or go to the nearest ED for appropriate evaluation and treatment of symptoms and to follow-up with his primary care provider  for his other medical issues, concerns and or health care needs.   Upon discharge, Braxten  adamantly denies any suicidal, homicidal ideations, auditory, visual hallucinations, paranoia and or delusional thoughts. He was provided with 14 days worth supply samples of his [facility] discharge medications. He left [facility] with all personal belongings via personal arranged transport in no apparent distress.  Consults:  psychiatry  Significant Diagnostic Studies:  labs: CBC with diff, CMP, UDS, toxicology tests, U/A  Discharge Vitals:   Blood pressure 124/78, pulse 84, temperature 97.5 F (36.4 C), temperature source Oral, resp. rate 16, height 5\' 6"  (1.676 m), weight 65.091 kg (143 lb 8 oz). Body mass index is 23.17 kg/(m^2). Lab Results:   Results for orders placed during the hospital encounter of 11/16/13 (from the past 72 hour(s))  GLUCOSE, CAPILLARY     Status: Abnormal   Collection Time    11/16/13  6:15 AM      Result Value Range   Glucose-Capillary 148 (*) 70 - 99 mg/dL  HEMOGLOBIN V7Q     Status: Abnormal   Collection Time    11/16/13  6:40 AM      Result Value Range   Hemoglobin A1C 6.4 (*) <5.7 %   Comment: (NOTE)                                                                               According to the ADA Clinical Practice Recommendations for 2011, when     HbA1c is used as a screening test:      >=6.5%   Diagnostic of Diabetes Mellitus               (if abnormal result is confirmed)     5.7-6.4%   Increased risk of developing Diabetes Mellitus     References:Diagnosis and Classification of Diabetes Mellitus,Diabetes     Care,2011,34(Suppl 1):S62-S69 and Standards of Medical Care in             Diabetes - 2011,Diabetes Care,2011,34 (Suppl 1):S11-S61.   Mean Plasma Glucose 137 (*) <117 mg/dL   Comment: Performed at [facility]  GLUCOSE, CAPILLARY     Status: Abnormal   Collection Time    11/16/13 11:56 AM      Result Value Range   Glucose-Capillary 169 (*) 70 - 99 mg/dL   Comment 1 Notify RN    GLUCOSE, CAPILLARY     Status: Abnormal   Collection Time     11/16/13  5:07 PM      Result Value Range   Glucose-Capillary 177 (*) 70 - 99 mg/dL  GLUCOSE, CAPILLARY     Status: Abnormal   Collection Time    11/16/13  8:55 PM      Result Value Range   Glucose-Capillary 146 (*) 70 - 99 mg/dL   Comment 1 Notify RN    GLUCOSE, CAPILLARY     Status: None   Collection Time    11/17/13  6:23 AM      Result Value Range   Glucose-Capillary 88  70 - 99 mg/dL   Comment 1 Notify RN    GLUCOSE, CAPILLARY     Status: Abnormal   Collection Time  11/17/13 11:57 AM      Result Value Range   Glucose-Capillary 104 (*) 70 - 99 mg/dL  GLUCOSE, CAPILLARY     Status: Abnormal   Collection Time    11/17/13  5:14 PM      Result Value Range   Glucose-Capillary 199 (*) 70 - 99 mg/dL  GLUCOSE, CAPILLARY     Status: Abnormal   Collection Time    11/17/13  9:48 PM      Result Value Range   Glucose-Capillary 234 (*) 70 - 99 mg/dL   Comment 1 Notify RN     Comment 2 Documented in Chart    GLUCOSE, CAPILLARY     Status: Abnormal   Collection Time    11/18/13  6:20 AM      Result Value Range   Glucose-Capillary 112 (*) 70 - 99 mg/dL    Physical Findings: AIMS: Facial and Oral Movements Muscles of Facial Expression: None, normal Lips and Perioral Area: None, normal Jaw: None, normal Tongue: None, normal,Extremity Movements Upper (arms, wrists, hands, fingers): None, normal Lower (legs, knees, ankles, toes): None, normal, Trunk Movements Neck, shoulders, hips: None, normal, Overall Severity Severity of abnormal movements (highest score from questions above): None, normal Incapacitation due to abnormal movements: None, normal Patient's awareness of abnormal movements (rate only patient's report): No Awareness, Dental Status Current problems with teeth and/or dentures?: No Does patient usually wear dentures?: No  CIWA:  CIWA-Ar Total: 5 COWS:     Psychiatric Specialty Exam: See Psychiatric Specialty Exam and Suicide Risk Assessment completed by Attending  Physician prior to discharge.  Discharge destination:  Home  Is patient on multiple antipsychotic therapies at discharge:  No   Has Patient had three or more failed trials of antipsychotic monotherapy by history:  No  Recommended Plan for Multiple Antipsychotic Therapies: NA     Medication List       Indication   insulin glargine 100 UNIT/ML injection  Commonly known as:  LANTUS  Inject 0.14 mLs (14 Units total) into the skin at bedtime.   Indication:  Type 2 Diabetes     insulin lispro 100 UNIT/ML Sopn  Commonly known as:  HUMALOG KWIKPEN  Inject 7-9 Units into the skin 3 (three) times daily with meals. 7 units at breakfast 7 units at lunch and 9 units at supper: For diabetes management   Indication:  Type 2 Diabetes     traZODone 50 MG tablet  Commonly known as:  DESYREL  Take 1 tablet (50 mg total) by mouth at bedtime and may repeat dose one time if needed. For sleep   Indication:  Trouble Sleeping       Follow-up Information   Follow up with Patient refusing follow-up..     Follow-up recommendations: Activity:  As tolerated Diet: As recommended by your primary care doctor. Keep all scheduled follow-up appointments as recommended.  Continue to work your relapse prevention plan Comments:  Take all your medications as prescribed by your mental healthcare provider. Report any adverse effects and or reactions from your medicines to your outpatient provider promptly. Patient is instructed and cautioned to not engage in alcohol and or illegal drug use while on prescription medicines. In the event of worsening symptoms, patient is instructed to call the crisis hotline, 911 and or go to the nearest ED for appropriate evaluation and treatment of symptoms. Follow-up with your primary care provider for your other medical issues, concerns and or health care needs.   Total Discharge Time:  Greater than 30 minutes.  Signed: Sanjuana Kava, PMHNP, FNP-BC 11/18/2013, 10:28  AM Agree with assessment and plan Reymundo Poll. Dub Mikes, M.D.

## 2013-11-18 NOTE — Progress Notes (Signed)
[facility] Adult Case Management Discharge Plan :  Will you be returning to the same living situation after discharge: Yes,  returning home At discharge, do you have transportation home?:Yes,  family will pick pt up Do you have the ability to pay for your medications:Yes,  access to meds  Release of information consent forms completed and in the chart;  Patient's signature needed at discharge.  Patient to Follow up at: Follow-up Information   Follow up with Patient refusing follow-up..      Patient denies SI/HI:   Yes,  denies SI/HI    Safety Planning and Suicide Prevention discussed:  Yes,  discussed with pt as pt refused.  See suicide prevention note.    Carmina Miller 11/18/2013, 9:56 AM

## 2013-11-18 NOTE — Progress Notes (Signed)
Patient ID: Erik Ray, male   DOB: Jan 12, 1971, 42 y.o.   MRN: [id number] Orders received for patient discharge. Patient states '' I'm ready to go , my step mother is coming to get me and I'm ready to go at one, I'll be following up in [place]'' Patient discharge instructions reviewed, copy of avs reviewed at length, signed, and provided to pt. Rx given, along with free medication supply. Crisis services reviewed at length. Pt denies SI/HI. No signs of acute decompensation. Escorted from unit to lobby .

## 2013-11-23 NOTE — Progress Notes (Signed)
Patient Discharge Instructions:  No documentation was faxed for [facility]IPS.  Per the SW the patient refused follow up.  Jerelene Redden, 11/23/2013, 1:48 PM

## 2013-11-27 ENCOUNTER — Emergency Department (HOSPITAL_COMMUNITY)
Admission: EM | Admit: 2013-11-27 | Discharge: 2013-11-28 | Disposition: A | Discharge status: Home / Self Care | Payer: BC Managed Care – PPO | Attending: Emergency Medicine | Admitting: Emergency Medicine

## 2013-11-27 ENCOUNTER — Encounter (HOSPITAL_COMMUNITY): Payer: Self-pay | Admitting: Emergency Medicine

## 2013-11-27 DIAGNOSIS — E119 Type 2 diabetes mellitus without complications: Secondary | ICD-10-CM | POA: Insufficient documentation

## 2013-11-27 DIAGNOSIS — F1420 Cocaine dependence, uncomplicated: Secondary | ICD-10-CM

## 2013-11-27 DIAGNOSIS — F1994 Other psychoactive substance use, unspecified with psychoactive substance-induced mood disorder: Secondary | ICD-10-CM

## 2013-11-27 DIAGNOSIS — F329 Major depressive disorder, single episode, unspecified: Secondary | ICD-10-CM | POA: Insufficient documentation

## 2013-11-27 DIAGNOSIS — F3289 Other specified depressive episodes: Secondary | ICD-10-CM | POA: Insufficient documentation

## 2013-11-27 DIAGNOSIS — F1920 Other psychoactive substance dependence, uncomplicated: Secondary | ICD-10-CM | POA: Insufficient documentation

## 2013-11-27 HISTORY — DX: Major depressive disorder, single episode, unspecified: F32.9

## 2013-11-27 HISTORY — DX: Depression, unspecified: F32.A

## 2013-11-27 LAB — COMPREHENSIVE METABOLIC PANEL
ALT: 14 U/L (ref 0–53)
BUN: 13 mg/dL (ref 6–23)
Calcium: 9.8 mg/dL (ref 8.4–10.5)
Creatinine, Ser: 1.09 mg/dL (ref 0.50–1.35)
GFR calc Af Amer: >90 mL/min (ref >90)
GFR calc non Af Amer: 82 mL/min — ABNORMAL LOW (ref >90)
Glucose, Bld: 195 mg/dL — ABNORMAL HIGH (ref 70–99)
Potassium: 3.8 mEq/L (ref 3.5–5.1)
Sodium: 140 mEq/L (ref 135–145)
Total Protein: 7.5 g/dL (ref 6.0–8.3)

## 2013-11-27 LAB — CBC
Hemoglobin: 15 g/dL (ref 13.0–17.0)
MCH: 30.9 pg (ref 26.0–34.0)
MCHC: 35.5 g/dL (ref 30.0–36.0)
MCV: 87 fL (ref 78.0–100.0)
RBC: 4.86 MIL/uL (ref 4.22–5.81)
RDW: 12.9 % (ref 11.5–15.5)

## 2013-11-27 LAB — RAPID URINE DRUG SCREEN, HOSP PERFORMED
Cocaine: POSITIVE — AB
Opiates: NOT DETECTED

## 2013-11-27 LAB — ETHANOL: Alcohol, Ethyl (B): <11 mg/dL (ref 0–11)

## 2013-11-27 LAB — SALICYLATE LEVEL: Salicylate Lvl: <2 mg/dL — ABNORMAL LOW (ref 2.8–20.0)

## 2013-11-27 LAB — ACETAMINOPHEN LEVEL: Acetaminophen (Tylenol), Serum: <15 ug/mL (ref 10–30)

## 2013-11-27 MED ORDER — IBUPROFEN 200 MG PO TABS: 600.0000 mg | ORAL_TABLET | Freq: Three times a day (TID) | ORAL | Status: DC | PRN

## 2013-11-27 MED ORDER — SODIUM CHLORIDE 0.9 % IV BOLUS (SEPSIS): 1000.0000 mL | Freq: Once | INTRAVENOUS | Status: DC

## 2013-11-27 MED ORDER — NICOTINE 21 MG/24HR TD PT24: 21.0000 mg | MEDICATED_PATCH | Freq: Every day | TRANSDERMAL | Status: DC

## 2013-11-27 MED ORDER — ZOLPIDEM TARTRATE 5 MG PO TABS: 5.0000 mg | ORAL_TABLET | Freq: Every evening | ORAL | Status: DC | PRN

## 2013-11-27 MED ORDER — INSULIN ASPART 100 UNIT/ML ~~LOC~~ SOLN
10.0000 [IU] | Freq: Once | SUBCUTANEOUS | Status: AC
  Administered 2013-11-28: 10 [IU] via SUBCUTANEOUS
  Filled 2013-11-27: qty 1

## 2013-11-27 MED ORDER — ALUM & MAG HYDROXIDE-SIMETH 200-200-20 MG/5ML PO SUSP: 30.0000 mL | ORAL | Status: DC | PRN

## 2013-11-27 MED ORDER — ONDANSETRON HCL 4 MG PO TABS
4.0000 mg | ORAL_TABLET | Freq: Three times a day (TID) | ORAL | Status: DC | PRN
Start: 2013-11-27 — End: 2013-11-28

## 2013-11-27 MED ORDER — SODIUM CHLORIDE 0.9 % IV BOLUS (SEPSIS)
2000.0000 mL | Freq: Once | INTRAVENOUS | Status: AC
  Administered 2013-11-28: 2000 mL via INTRAVENOUS

## 2013-11-27 MED ORDER — LORAZEPAM 1 MG PO TABS: 1.0000 mg | ORAL_TABLET | Freq: Three times a day (TID) | ORAL | Status: DC | PRN

## 2013-11-27 NOTE — ED Notes (Addendum)
Pt states he has been on a "crack binge" for 5 weeks and just cant do it anymore.  Pt states he has not been eating much and is diabetic with no insuline coverage during that time.  CBG 174 at triage

## 2013-11-27 NOTE — ED Provider Notes (Signed)
CSN: [id number]     Arrival date & time 11/27/13  2124 History   First MD Initiated Contact with Patient 11/27/13 2211     Chief Complaint  Patient presents with  . Addiction Problem   (Consider location/radiation/quality/duration/timing/severity/associated sxs/prior Treatment) The history is provided by the patient and medical records. No language interpreter was used.    Erik Ray is a 42 y.o. male  with a hx of depression, anxiety, previous suicide attempt presents to the Emergency Department complaining of gradual, persistent, progressively worsening "crack been" onset 5 weeks ago.  Record review shows the patient was evaluated on 11-2013 for feelings of helplessness and hopelessness while feeling suicidal.  At bedtime he reported that he broken up with his girlfriend in May of 2014 and had "not gotten over her."  he reports he recently began using crack again and lost his job.    Patient again reports feelings of helplessness and hopelessness. He reports he did not technically plan a suicide today however if he were to kill himself he would use carbon monoxide.  Patient's friend at the bedside reports that when he was seen several days ago he was released and began using crack again prior to presenting to rehabilitation.  He reports history of insulin-dependent diabetes but has not taken his diabetes medications in several weeks.  Past Medical History  Diagnosis Date  . Diabetes mellitus without complication    Past Surgical History  Procedure Laterality Date  . No past surgeries     History reviewed. No pertinent family history. History  Substance Use Topics  . Smoking status: Current Every Day Smoker -- 1.00 packs/day for 20 years    Types: Cigarettes  . Smokeless tobacco: Not on file  . Alcohol Use: No    Review of Systems  Constitutional: Negative for fever, appetite change and fatigue.  Respiratory: Negative for cough, chest tightness and shortness of breath.    Cardiovascular: Negative for chest pain.  Gastrointestinal: Negative for nausea, vomiting, abdominal pain and diarrhea.  Genitourinary: Negative for dysuria, urgency and frequency.  Musculoskeletal: Negative for arthralgias, myalgias and neck stiffness.  Skin: Negative for rash.  Neurological: Negative for light-headedness and headaches.  Psychiatric/Behavioral: Positive for suicidal ideas. Negative for hallucinations, sleep disturbance, self-injury and agitation. The patient is not nervous/anxious.   All other systems reviewed and are negative.    Allergies  Review of patient's allergies indicates no known allergies.  Home Medications   Current Outpatient Rx  Name  Route  Sig  Dispense  Refill  . insulin glargine (LANTUS) 100 UNIT/ML injection   Subcutaneous   Inject 14 Units into the skin at bedtime.         . insulin lispro (HUMALOG) 100 UNIT/ML injection   Subcutaneous   Inject 7-9 Units into the skin 3 (three) times daily before meals. Uses 7 units in the morning, 7 units at lunch, and 9 units in the evening          BP 123/83  Pulse 104  Temp(Src) 98.2 F (36.8 C) (Oral)  Resp 18  Ht 5\' 7"  (1.702 m)  Wt 142 lb 4.8 oz (64.547 kg)  BMI 22.28 kg/m2  SpO2 98% Physical Exam  Nursing note and vitals reviewed. Constitutional: He appears well-developed and well-nourished. No distress.  alert, sleepy, nontoxic appearance  HENT:  Head: Normocephalic and atraumatic.  Mouth/Throat: Oropharynx is clear and moist. No oropharyngeal exudate.  Eyes: Conjunctivae and EOM are normal. Pupils are equal, round, and reactive  to light. No scleral icterus.  Neck: Normal range of motion. Neck supple.  Cardiovascular: Normal rate, regular rhythm, normal heart sounds and intact distal pulses.   No murmur heard. Pulmonary/Chest: Effort normal and breath sounds normal. No respiratory distress. He has no wheezes.  Abdominal: Soft. Bowel sounds are normal. He exhibits no mass. There is no  tenderness. There is no rebound and no guarding.  Musculoskeletal: Normal range of motion. He exhibits no edema.  Lymphadenopathy:    He has no cervical adenopathy.  Neurological: He exhibits normal muscle tone. Coordination normal.  Patient sleepy but arousable to voice Answers questions appropriately Moves extremities without ataxia  Skin: Skin is warm and dry. No rash noted. He is not diaphoretic. No erythema.  Several open wounds to the bilateral feet, no erythema induration or evidence of abscess  Psychiatric: He has a normal mood and affect.    ED Course  Procedures (including critical care time) Labs Review Labs Reviewed  GLUCOSE, CAPILLARY - Abnormal; Notable for the following:    Glucose-Capillary 174 (*)    All other components within normal limits  CBC - Abnormal; Notable for the following:    WBC 12.8 (*)    All other components within normal limits  COMPREHENSIVE METABOLIC PANEL - Abnormal; Notable for the following:    Glucose, Bld 195 (*)    GFR calc non Af Amer 82 (*)    All other components within normal limits  SALICYLATE LEVEL - Abnormal; Notable for the following:    Salicylate Lvl <2.0 (*)    All other components within normal limits  URINE RAPID DRUG SCREEN (HOSP PERFORMED) - Abnormal; Notable for the following:    Cocaine POSITIVE (*)    All other components within normal limits  ACETAMINOPHEN LEVEL  ETHANOL  URINALYSIS, ROUTINE W REFLEX MICROSCOPIC   Imaging Review No results found.  EKG Interpretation   None       MDM   1. Substance induced mood disorder   2. Cocaine dependence   3. Insulin dependent diabetes mellitus      Erik Ray presents with suicidal ideations and drug abuse.  History of depression and apparent worsening of depression, persistent helplessness and hopelessness and suicidal ideations.  History of previous suicide attempt and relapse of crack use.    12:50 AM PCP is positive for cocaine. CBC 12.8. CMP with  glucose of 195 and anion gap of 14.  We'll give insulin 10 units subcutaneous and fluid.  TTS is pending.   1:33 AM Discussed with Erik Ray who reports that the patient meets inpatient criteria.  She did that status here at [facility] patient will be transferred to the [facility] long site ED. [place] Ray believes the patient will be able to be placed tomorrow.  istat Chem 8 pending and if anion gap has closed patient will be medically cleared.  2:25 AM Patient has closed his anion gap. Patient medically cleared and will be transferred to [facility]. Report given to Dr. Gwyneth Sprout who will except the transfer.   Erik Client Zayyan Mullen, PA-C 11/28/13 [phone]

## 2013-11-27 NOTE — ED Notes (Signed)
Pt has been in rehab several times.  Was last here 12 weeks ago and was in Behavior Health for 72 hrs and then discharged.  Friends at bedside st's pt started using again after being discharged.  Pt last used 3 hours ago

## 2013-11-28 ENCOUNTER — Encounter (HOSPITAL_COMMUNITY): Payer: Self-pay | Admitting: Behavioral Health

## 2013-11-28 DIAGNOSIS — F329 Major depressive disorder, single episode, unspecified: Secondary | ICD-10-CM

## 2013-11-28 DIAGNOSIS — F1910 Other psychoactive substance abuse, uncomplicated: Secondary | ICD-10-CM

## 2013-11-28 LAB — POCT I-STAT, CHEM 8
BUN: 14 mg/dL (ref 6–23)
Calcium, Ion: 1.12 mmol/L (ref 1.12–1.23)
Chloride: 105 mEq/L (ref 96–112)
Creatinine, Ser: 1.1 mg/dL (ref 0.50–1.35)
Glucose, Bld: 68 mg/dL — ABNORMAL LOW (ref 70–99)
Potassium: 3.4 mEq/L — ABNORMAL LOW (ref 3.5–5.1)
Sodium: 141 mEq/L (ref 135–145)

## 2013-11-28 MED ORDER — INSULIN ASPART 100 UNIT/ML ~~LOC~~ SOLN
7.0000 [IU] | Freq: Three times a day (TID) | SUBCUTANEOUS | Status: DC
  Administered 2013-11-28: 7 [IU] via SUBCUTANEOUS
  Filled 2013-11-28: qty 1

## 2013-11-28 MED ORDER — INSULIN GLARGINE 100 UNIT/ML ~~LOC~~ SOLN
14.0000 [IU] | Freq: Every day | SUBCUTANEOUS | Status: DC
  Administered 2013-11-28: 14 [IU] via SUBCUTANEOUS
  Filled 2013-11-28 (×2): qty 0.14

## 2013-11-28 NOTE — ED Notes (Signed)
Pt leaving [facility] facility now with [facility] transportation associate.

## 2013-11-28 NOTE — Progress Notes (Addendum)
Patient accepted to [facility] by Dr. Wendall Stade for admission. Pt to be transported voluntarily to [facility] by [employer]. CSW informed rn who will arrange transportation. RN to call report to [phone].  Marland KitchenFrutoso Schatz [phone]  ED CSW 11/28/2013 1336pm

## 2013-11-28 NOTE — BHH Counselor (Addendum)
Writer attempted to call in to tele cart and was unable to make a connection. Kim-RN will start IV for insulin and the pt is being moved to Pod C, she will call once the pt is moved. Writer contacted Dr. Silverio Lay to inform him of the delay in completing the assessment due to the tele machine lack of connectivity in Pod A, he is in agreement with waiting until the pt is moved to Pod C. Denice Bors, [facility] 11/28/2013 12:07 AM

## 2013-11-28 NOTE — BHH Counselor (Signed)
Placement contacts: [facility]: Faxed, Pending-expecting dc's in AM [place]=0 [address]= [address] [place], [place], [place] 11/28/2013 2:58 AM

## 2013-11-28 NOTE — ED Notes (Signed)
Called received from Erik Ray during Erik Ray assessment. Pt. Not wanting to complete assessment. He is wanting something to drink. Pt. Given call bell. Pt. Given water at this time and was able to complete Erik Ray assessment.

## 2013-11-28 NOTE — ED Notes (Signed)
Pt moved to POD-C for Tele psych then will return to POD-A.   Friends left to go home at this time.

## 2013-11-28 NOTE — BH Assessment (Signed)
Tele Assessment Note   Erik Ray is an 42 y.o. male presents voluntarily to [facility] requesting detox from "crack" and stating "I don't want to continue living in my current situation". Pt denies HI, AVH, Delusions or Psychosis. Pt oriented x' 4, sleepy, irritated to be awaken to complete the assessment and resistantly cooperative. Pt reports that he has continued to smoke crack and when asked if he is drinking he said "when I have money for it". Pt reports feeling hopeless, guilty tearful, isolating, loss of interest in usual pleasures, irritable and worthless. Pt confirms two (2) previous attempts to end his life. Pt is not able to contract for safety. Pt was discharged from [facility] on 11/17/2013 and reports that he recently lost his job. Ranae Pila, LCAS, ICAADC 11/28/2013 2:15 AM  Axis I: Major Depression, Recurrent severe and Substance Abuse Axis II: Deferred Axis III:  Past Medical History  Diagnosis Date  . Diabetes mellitus without complication   . Depression    Axis IV: economic problems, educational problems, occupational problems, other psychosocial or environmental problems, problems related to social environment, problems with access to health care services and problems with primary support group Axis V: 41-50 serious symptoms  Past Medical History:  Past Medical History  Diagnosis Date  . Diabetes mellitus without complication   . Depression     Past Surgical History  Procedure Laterality Date  . No past surgeries      Family History: History reviewed. No pertinent family history.  Social History:  reports that he has been smoking Cigarettes.  He has a 20 pack-year smoking history. He does not have any smokeless tobacco history on file. He reports that he uses illicit drugs ("Crack" cocaine and Cocaine). He reports that he does not drink alcohol.  Additional Social History:  Alcohol / Drug Use Pain Medications: none Prescriptions: none Over the Counter:  none History of alcohol / drug use?: Yes Longest period of sobriety (when/how long): 11 months May 2013 tiil April 2014 Negative Consequences of Use: Financial;Personal relationships;Legal;Work / School Substance #1 Name of Substance 1: crack cocaine 1 - Age of First Use: 20 1 - Amount (size/oz): 5 grams 1 - Frequency: daily 1 - Duration: 3 weeks 1 - Last Use / Amount: 11/27/13  CIWA: CIWA-Ar BP: 123/83 mmHg Pulse Rate: 104 COWS:    Allergies: No Known Allergies  Home Medications:  (Not in a hospital admission)  OB/GYN Status:  No LMP for male patient.  General Assessment Data Location of Assessment: [facility] Assessment Services Is this a Tele or Face-to-Face Assessment?: Tele Assessment Is this an Initial Assessment or a Re-assessment for this encounter?: Initial Assessment Living Arrangements: Parent Can pt return to current living arrangement?: Yes Admission Status: Voluntary Is patient capable of signing voluntary admission?: Yes Transfer from: Home Referral Source: Self/Family/Friend  Medical Screening Exam [facility] Walk[facility]) Medical Exam completed: Yes  [facility] Crisis Care Plan Living Arrangements: Parent     Risk to self Suicidal Ideation: Yes-Currently Present Suicidal Intent: No Is patient at risk for suicide?: Yes Suicidal Plan?: No Specify Current Suicidal Plan:  (none noted) Access to Means: No What has been your use of drugs/alcohol within the last 12 months?:  (crack cocaine) Previous Attempts/Gestures: Yes How many times?:  (2) Other Self Harm Risks:  (none noted) Triggers for Past Attempts: Other personal contacts Intentional Self Injurious Behavior: None Family Suicide History: No Recent stressful life event(s): Conflict (Comment);Loss (Comment);Job Loss;Financial Problems Persecutory voices/beliefs?: No Depression: Yes Depression Symptoms:  Isolating;Fatigue;Guilt;Loss of interest in usual pleasures;Feeling worthless/self pity;Feeling  angry/irritable Substance abuse history and/or treatment for substance abuse?: Yes Suicide prevention information given to non-admitted patients: Not applicable  Risk to Others Homicidal Ideation: No Thoughts of Harm to Others: No Current Homicidal Intent: No Current Homicidal Plan: No Access to Homicidal Means: No History of harm to others?: No Assessment of Violence: None Noted Does patient have access to weapons?: No Criminal Charges Pending?: No Does patient have a court date: No  Psychosis Hallucinations: None noted Delusions: None noted  Mental Status Report Appear/Hygiene: Disheveled Eye Contact: Poor Motor Activity: Freedom of movement Speech: Logical/coherent Level of Consciousness: Alert Mood: Depressed Affect: Appropriate to circumstance Anxiety Level: None Thought Processes: Coherent;Relevant Judgement: Impaired Orientation: Person;Place;Time;Situation Obsessive Compulsive Thoughts/Behaviors: None  Cognitive Functioning Concentration: Decreased Memory: Recent Impaired;Remote Intact IQ: Average Insight: Poor Impulse Control: Poor Appetite: Poor Weight Loss:  (pt unsure) Weight Gain:  (0) Sleep: Decreased Total Hours of Sleep:  (2-3/24) Vegetative Symptoms: Decreased grooming  ADLScreening [facility] Assessment Services) Patient's cognitive ability adequate to safely complete daily activities?: Yes Patient able to express need for assistance with ADLs?: Yes Independently performs ADLs?: Yes (appropriate for developmental age)  Prior Inpatient Therapy Prior Inpatient Therapy: Yes Prior Therapy Dates: 1999, mid 2000, 2014 Prior Therapy Facilty/Provider(s): [facility] Reason for Treatment: OD, OD, SA  Prior Outpatient Therapy Prior Outpatient Therapy: Yes Prior Therapy Dates: October 2014 Prior Therapy Facilty[facility]Provider(s[facility] Reason for Treatment: SA  ADL Screening (condition at time  of admission) Patient's cognitive ability adequate to safely complete daily activities?: Yes Is the patient deaf or have difficulty hearing?: No Does the patient have difficulty seeing, even when wearing glasses/contacts?: No Does the patient have difficulty concentrating, remembering, or making decisions?: Yes Patient able to express need for assistance with ADLs?: Yes Does the patient have difficulty dressing or bathing?: No Independently performs ADLs?: Yes (appropriate for developmental age) Does the patient have difficulty walking or climbing stairs?: No Weakness of Legs: None Weakness of Arms/Hands: None  Home Assistive Devices/Equipment Home Assistive Devices/Equipment: None  Therapy Consults (therapy consults require a physician order) PT Evaluation Needed: No OT Evalulation Needed: No SLP Evaluation Needed: No Abuse/Neglect Assessment (Assessment to be complete while patient is alone) Physical Abuse: Denies Verbal Abuse: Denies Sexual Abuse: Denies Exploitation of patient/patient's resources: Denies Self-Neglect: Denies Values / Beliefs Cultural Requests During Hospitalization: None Spiritual Requests During Hospitalization: None Consults Spiritual Care Consult Needed: No Social Work Consult Needed: No Merchant navy officer (For Healthcare) Advance Directive: Patient does not have advance directive Pre-existing out of facility DNR order (yellow form or pink MOST form): No Nutrition Screen- [facility] Adult/WL/AP Patient's home diet: Regular  Additional Information 1:1 In Past 12 Months?: No CIRT Risk: No Elopement Risk: No Does patient have medical clearance?: Yes     Disposition: Pt case discussed with Alberteen Sam, NP, inpatient recommendation and currently there are no beds at [facility]. Placement will be sought elsewhere. Lorelle Formosa, PA was informed of the pt's disposition and writer was informed that due to staffing and the large number of pediatric psy pt's in the ED, pt [place]  will be transferred to [facility] ED.  Disposition Initial Assessment Completed for this Encounter: Yes Disposition of Patient: Inpatient treatment program Type of inpatient treatment program: Adult  Manual Meier 11/28/2013 2:02 AM

## 2013-11-28 NOTE — ED Notes (Signed)
RN contacted [facility] for transfer to [facility] ED.

## 2013-11-28 NOTE — Progress Notes (Signed)
Pt refused placement at [facility]. Pt denies SI/HI/AH/VH. Per discussion with psychiatrist, patient stated SI because he wanted treatment however not truly SI. CSW provided pt with outpatient resources. CSW informed RN. CSW called EDP. Pt dc pending EDP discharging patient.   Catha Gosselin, LCSW [phone]  ED CSW .11/28/2013 1431pm

## 2013-11-28 NOTE — [facility] Suicide Risk Assessment (Signed)
Suicide Risk Assessment  Discharge Assessment     Demographic Factors:  Male, Caucasian and Unemployed  Subjective (update): Pt now reports that he only stated that he was suicidal to get admitted somewhere. He denies ever truly being suicidal since presenting to the ED. He states his only problem is substance abuse issues, and he wants to get clean. He now denies SI/HI/AVH. He wants to live for himself, and for his 42 year old son. He wants to look for another job. He lives in a boarding house, and has a girlfriend.  Current Mental Status by Physician: Psychiatric Specialty Exam: Physical Exam  ROS  Blood pressure 121/79, pulse 67, temperature 98.1 F (36.7 C), temperature source Oral, resp. rate 16, height 5\' 7"  (1.702 m), weight 64.547 kg (142 lb 4.8 oz), SpO2 99.00%.Body mass index is 22.28 kg/(m^2).  General Appearance: Casual and Fairly Groomed  Patent attorney::  Fair  Speech:  Normal Rate  Volume:  Normal  Mood:  Depressed  Affect:  Appropriate, Congruent and Constricted  Thought Process:  Coherent and Goal Directed  Orientation:  Full (Time, Place, and Person)  Thought Content:  Hallucinations: None  Suicidal Thoughts:  No  Homicidal Thoughts:  No  Memory:  Immediate;   Fair Recent;   Fair Remote;   Fair  Judgement:  Fair  Insight:  Fair  Psychomotor Activity:  Normal  Concentration:  Fair  Recall:  Fair  Akathisia:  Negative  Handed:  Right  AIMS (if indicated):     Assets:  Communication Skills Desire for Improvement Financial Resources/Insurance Housing Social Support  Sleep:        Loss Factors: Decrease in vocational status  Historical Factors: Prior suicide attempts  Risk Reduction Factors:   Responsible for children under 66 years of age, Sense of responsibility to family and Positive social support  Continued Clinical Symptoms:  Alcohol/Substance Abuse/Dependencies  Cognitive Features That Contribute To Risk:  Closed-mindedness    Suicide Risk:   Minimal: No identifiable suicidal ideation.  Patients presenting with no risk factors but with morbid ruminations; may be classified as minimal risk based on the severity of the depressive symptoms  Discharge Diagnoses:   AXIS I:  Substance Abuse AXIS II:  Deferred AXIS III:   Past Medical History  Diagnosis Date  . Diabetes mellitus without complication   . Depression    AXIS IV:  other psychosocial or environmental problems AXIS V:  61-70 mild symptoms  Plan Of Care/Follow-up recommendations:  Other:  f/u with outpatient substance abuse counseling. Pt refused inpatient detox, because the facility that had a bed available was too far away. Will discharge patient today, with outpatient referral sources given.  Is patient on multiple antipsychotic therapies at discharge:  No   Has Patient had three or more failed trials of antipsychotic monotherapy by history:  No  Recommended Plan for Multiple Antipsychotic Therapies: NA  Laraya Pestka 11/28/2013, 2:59 PM

## 2013-11-28 NOTE — ED Notes (Signed)
Pt is awake and alert, pleasant and cooperative. Patient denies HI, SI AH or VH. Discharge vitals 135/89 HR 103 RR 16 and unlabored. Pt has outpatient treatment resources avab. Will continue to monitor for safety. Patient escorted to lobby without incident. T.Melvyn Neth RN

## 2013-11-28 NOTE — Consult Note (Signed)
[facility] Face-to-Face Psychiatry Consult   Reason for Consult:  "I don't want to continue living in my current situation" Referring Physician:  EDP  Erik Ray is an 42 y.o. male.  Assessment: AXIS I:  Depressive Disorder NOS and Substance Abuse AXIS II:  Deferred AXIS III:   Past Medical History  Diagnosis Date  . Diabetes mellitus without complication   . Depression    AXIS IV:  other psychosocial or environmental problems AXIS V:  21-30 behavior considerably influenced by delusions or hallucinations OR serious impairment in judgment, communication OR inability to function in almost all areas  Plan:  Recommend psychiatric Inpatient admission when medically cleared.  Subjective:   Erik Ray is a 42 y.o. male patient admitted with requesting detox from "crack" and stating "I don't want to continue living in my current situation". Chart was reviewed. Pt reports being tired of smoking crack, and wants to quit. Pt recently lost his job. Pt continues to report passive suicidal ideation, but denies current plan/intent to hurt himself. He denies HI/AVH. Pt reports feeling depressed, mainly because of his continued crack use (using "as much as possible"). He reports good sleep and appetite currently. No current psychotropic medications. He denies other illicit drug use. He denies alcohol use. He was recently at [facility] 90-day program, and was discharged 2 months ago (but denies having follow up). In 1997, he overdosed on 100 phenobarbital tabs, and was admitted in [place] for 1 wk (he cannot remember if he was prescribed medication at the time).    Past Psychiatric History: Past Medical History  Diagnosis Date  . Diabetes mellitus without complication   . Depression     reports that he has been smoking Cigarettes.  He has a 20 pack-year smoking history. He does not have any smokeless tobacco history on file. He reports that he uses illicit drugs ("Crack" cocaine and  Cocaine). He reports that he does not drink alcohol. History reviewed. No pertinent family history. Family History Substance Abuse:  (crack cocaine) Family Supports: Yes, List: (step mom) Living Arrangements: Parent Can pt return to current living arrangement?: Yes Abuse/Neglect [facility]) Physical Abuse: Denies Verbal Abuse: Denies Sexual Abuse: Denies Allergies:  No Known Allergies  ACT Assessment Complete:  Yes:    Educational Status    Risk to Self: Risk to self Suicidal Ideation: Yes-Currently Present Suicidal Intent: No Is patient at risk for suicide?: Yes Suicidal Plan?: No Specify Current Suicidal Plan:  (none noted) Access to Means: No What has been your use of drugs/alcohol within the last 12 months?:  (crack cocaine) Previous Attempts/Gestures: Yes How many times?:  (2) Other Self Harm Risks:  (none noted) Triggers for Past Attempts: Other personal contacts Intentional Self Injurious Behavior: None Family Suicide History: No Recent stressful life event(s): Conflict (Comment);Loss (Comment);Job Loss;Financial Problems Persecutory voices/beliefs?: No Depression: Yes Depression Symptoms: Isolating;Fatigue;Guilt;Loss of interest in usual pleasures;Feeling worthless/self pity;Feeling angry/irritable Substance abuse history and/or treatment for substance abuse?: Yes Suicide prevention information given to non-admitted patients: Not applicable  Risk to Others: Risk to Others Homicidal Ideation: No Thoughts of Harm to Others: No Current Homicidal Intent: No Current Homicidal Plan: No Access to Homicidal Means: No History of harm to others?: No Assessment of Violence: None Noted Does patient have access to weapons?: No Criminal Charges Pending?: No Does patient have a court date: No  Abuse: Abuse/Neglect Assessment (Assessment to be complete while patient is alone) Physical Abuse: Denies Verbal Abuse: Denies Sexual Abuse: Denies Exploitation of  patient/patient's  resources: Denies Self-Neglect: Denies  Prior Inpatient Therapy: Prior Inpatient Therapy Prior Inpatient Therapy: Yes Prior Therapy Dates: 1999, mid 2000, 2014 Prior Therapy Facilty/Provider(s): [facility] [place] [place] Reason for Treatment: OD, OD, SA  Prior Outpatient Therapy: Prior Outpatient Therapy Prior Outpatient Therapy: Yes Prior Therapy Dates: October 2014 Prior Therapy Facilty/Provider(s): [facility] Reason for Treatment: SA  Additional Information: Additional Information 1:1 In Past 12 Months?: No CIRT Risk: No Elopement Risk: No Does patient have medical clearance?: Yes                  Objective: Blood pressure 121/79, pulse 67, temperature 98.1 F (36.7 C), temperature source Oral, resp. rate 16, height 5\' 7"  (1.702 m), weight 64.547 kg (142 lb 4.8 oz), SpO2 99.00%.Body mass index is 22.28 kg/(m^2). Results for orders placed during the hospital encounter of 11/27/13 (from the past 72 hour(s))  GLUCOSE, CAPILLARY     Status: Abnormal   Collection Time    11/27/13  9:29 PM      Result Value Range   Glucose-Capillary 174 (*) 70 - 99 mg/dL  ACETAMINOPHEN LEVEL     Status: None   Collection Time    11/27/13  9:33 PM      Result Value Range   Acetaminophen (Tylenol), Serum <15.0  10 - 30 ug/mL   Comment:            THERAPEUTIC CONCENTRATIONS VARY     SIGNIFICANTLY. A RANGE OF 10-30     ug/mL MAY BE AN EFFECTIVE     CONCENTRATION FOR MANY PATIENTS.     HOWEVER, SOME ARE BEST TREATED     AT CONCENTRATIONS OUTSIDE THIS     RANGE.     ACETAMINOPHEN CONCENTRATIONS     >150 ug/mL AT 4 HOURS AFTER     INGESTION AND >50 ug/mL AT 12     HOURS AFTER INGESTION ARE     OFTEN ASSOCIATED WITH TOXIC     REACTIONS.  CBC     Status: Abnormal   Collection Time    11/27/13  9:33 PM      Result Value Range   WBC 12.8 (*) 4.0 - 10.5 K/uL   RBC 4.86  4.22 - 5.81 MIL/uL   Hemoglobin 15.0  13.0 - 17.0 g/dL   HCT 16.1   09.6 - 04.5 %   MCV 87.0  78.0 - 100.0 fL   MCH 30.9  26.0 - 34.0 pg   MCHC 35.5  30.0 - 36.0 g/dL   RDW 40.9  81.1 - 91.4 %   Platelets 385  150 - 400 K/uL  COMPREHENSIVE METABOLIC PANEL     Status: Abnormal   Collection Time    11/27/13  9:33 PM      Result Value Range   Sodium 140  135 - 145 mEq/L   Potassium 3.8  3.5 - 5.1 mEq/L   Chloride 99  96 - 112 mEq/L   CO2 27  19 - 32 mEq/L   Glucose, Bld 195 (*) 70 - 99 mg/dL   BUN 13  6 - 23 mg/dL   Creatinine, Ser 7.82  0.50 - 1.35 mg/dL   Calcium 9.8  8.4 - 95.6 mg/dL   Total Protein 7.5  6.0 - 8.3 g/dL   Albumin 4.1  3.5 - 5.2 g/dL   AST 16  0 - 37 U/L   ALT 14  0 - 53 U/L   Alkaline Phosphatase 92  39 - 117 U/L   Total Bilirubin 0.3  0.3 - 1.2 mg/dL   GFR calc non Af Amer 82 (*) >90 mL/min   GFR calc Af Amer >90  >90 mL/min   Comment: (NOTE)     The eGFR has been calculated using the CKD EPI equation.     This calculation has not been validated in all clinical situations.     eGFR's persistently <90 mL/min signify possible Chronic Kidney     Disease.  ETHANOL     Status: None   Collection Time    11/27/13  9:33 PM      Result Value Range   Alcohol, Ethyl (B) <11  0 - 11 mg/dL   Comment:            LOWEST DETECTABLE LIMIT FOR     SERUM ALCOHOL IS 11 mg/dL     FOR MEDICAL PURPOSES ONLY  SALICYLATE LEVEL     Status: Abnormal   Collection Time    11/27/13  9:33 PM      Result Value Range   Salicylate Lvl <2.0 (*) 2.8 - 20.0 mg/dL  URINE RAPID DRUG SCREEN (HOSP PERFORMED)     Status: Abnormal   Collection Time    11/27/13  9:50 PM      Result Value Range   Opiates NONE DETECTED  NONE DETECTED   Cocaine POSITIVE (*) NONE DETECTED   Benzodiazepines NONE DETECTED  NONE DETECTED   Amphetamines NONE DETECTED  NONE DETECTED   Tetrahydrocannabinol NONE DETECTED  NONE DETECTED   Barbiturates NONE DETECTED  NONE DETECTED   Comment:            DRUG SCREEN FOR MEDICAL PURPOSES     ONLY.  IF CONFIRMATION IS NEEDED     FOR  ANY PURPOSE, NOTIFY LAB     WITHIN 5 DAYS.                LOWEST DETECTABLE LIMITS     FOR URINE DRUG SCREEN     Drug Class       Cutoff (ng/mL)     Amphetamine      1000     Barbiturate      200     Benzodiazepine   200     Tricyclics       300     Opiates          300     Cocaine          300     THC              50  POCT I-STAT, CHEM 8     Status: Abnormal   Collection Time    11/28/13  2:02 AM      Result Value Range   Sodium 141  135 - 145 mEq/L   Potassium 3.4 (*) 3.5 - 5.1 mEq/L   Chloride 105  96 - 112 mEq/L   BUN 14  6 - 23 mg/dL   Creatinine, Ser 9.52  0.50 - 1.35 mg/dL   Glucose, Bld 68 (*) 70 - 99 mg/dL   Calcium, Ion 8.41  3.24 - 1.23 mmol/L   TCO2 31  0 - 100 mmol/L   Hemoglobin 13.6  13.0 - 17.0 g/dL   HCT 40.1  02.7 - 25.3 %  GLUCOSE, CAPILLARY     Status: None   Collection Time    11/28/13  4:15 AM      Result  Value Range   Glucose-Capillary 81  70 - 99 mg/dL  GLUCOSE, CAPILLARY     Status: Abnormal   Collection Time    11/28/13  7:43 AM      Result Value Range   Glucose-Capillary 231 (*) 70 - 99 mg/dL   Labs are reviewed and are pertinent for +cocaine in UDS.  Current Facility-Administered Medications  Medication Dose Route Frequency Provider Last Rate Last Dose  . alum & mag hydroxide-simeth (MAALOX/MYLANTA) 200-200-20 MG/5ML suspension 30 mL  30 mL Oral PRN Hannah Muthersbaugh, PA-C      . ibuprofen (ADVIL,MOTRIN) tablet 600 mg  600 mg Oral Q8H PRN Hannah Muthersbaugh, PA-C      . insulin aspart (novoLOG) injection 7-9 Units  7-9 Units Subcutaneous TID WC Hannah Muthersbaugh, PA-C   7 Units at 11/28/13 0856  . insulin glargine (LANTUS) injection 14 Units  14 Units Subcutaneous QHS Hannah Muthersbaugh, PA-C   14 Units at 11/28/13 0247  . LORazepam (ATIVAN) tablet 1 mg  1 mg Oral Q8H PRN Hannah Muthersbaugh, PA-C      . nicotine (NICODERM CQ - dosed in mg/24 hours) patch 21 mg  21 mg Transdermal Daily Hannah Muthersbaugh, PA-C      . ondansetron  (ZOFRAN) tablet 4 mg  4 mg Oral Q8H PRN Hannah Muthersbaugh, PA-C      . zolpidem (AMBIEN) tablet 5 mg  5 mg Oral QHS PRN Dierdre Forth, PA-C       Current Outpatient Prescriptions  Medication Sig Dispense Refill  . insulin glargine (LANTUS) 100 UNIT/ML injection Inject 14 Units into the skin at bedtime.      . insulin lispro (HUMALOG) 100 UNIT/ML injection Inject 7-9 Units into the skin 3 (three) times daily before meals. Uses 7 units in the morning, 7 units at lunch, and 9 units in the evening        Psychiatric Specialty Exam:     Blood pressure 121/79, pulse 67, temperature 98.1 F (36.7 C), temperature source Oral, resp. rate 16, height 5\' 7"  (1.702 m), weight 64.547 kg (142 lb 4.8 oz), SpO2 99.00%.Body mass index is 22.28 kg/(m^2).  General Appearance: Disheveled and Guarded  Eye Contact::  Poor  Speech:  Normal Rate  Volume:  Normal  Mood:  Depressed  Affect:  Congruent and Depressed  Thought Process:  Goal Directed  Orientation:  Full (Time, Place, and Person)  Thought Content:  Hallucinations: None  Suicidal Thoughts:  Yes.  without intent/plan  Homicidal Thoughts:  No  Memory:  Negative  Judgement:  Impaired  Insight:  Lacking  Psychomotor Activity:  Decreased  Concentration:  Poor  Recall:  Fair  Akathisia:  Negative  Handed:  Right  AIMS (if indicated):     Assets:  Psychologist, counselling Resources/Insurance  Sleep:      Treatment Plan Summary: Daily contact with patient to assess and evaluate symptoms and progress in treatment Medication management Will pursue inpatient bed for safety/stabilization, since pt has a history of overdose in 1997. Pt reports feeling "less suicidal" than he was yesterday, since the cocaine is starting to clear his system. Pt ate half a sandwich during the interview, and then proceeded to lie down and close his eyes.  Ancil Linsey 11/28/2013 12:24 PM

## 2013-11-28 NOTE — ED Notes (Signed)
Pt. Moved back to POD A from POD C.

## 2013-11-28 NOTE — ED Provider Notes (Signed)
Patient seen by psychiatry and cleared for discharge  Toy Baker, MD 11/28/13 1529

## 2013-11-28 NOTE — Progress Notes (Signed)
Pt referred to [facility] pending review.   Catha Gosselin, LCSW [phone]  ED CSW .11/28/2013 1252pm

## 2013-11-28 NOTE — ED Notes (Signed)
Transporting Patient to Pod C for Tele-Psych interview.

## 2013-11-28 NOTE — ED Notes (Signed)
Pt transferred from [facility] ED, presents for medical clearance, requesting detox after crack binge.  Pt reports he feels hopeless, Denies SI or HI, no AV hallucinations.  Pt is Diabetic.  Pt reports attempted SI in 1997 by Phenobarbital overdose.  Pt calm & cooperative at present.  TTS assessment performed at [facility].

## 2013-11-28 NOTE — ED Notes (Signed)
Transported patient back to Pod A, after [facility].

## 2013-11-28 NOTE — ED Notes (Signed)
TTS complete, pt back in room A3, alert, NAD, calm. Pending [facility] TTS recommendation.

## 2013-11-29 NOTE — ED Provider Notes (Signed)
Medical screening examination/treatment/procedure(s) were performed by non-physician practitioner and as supervising physician I was immediately available for consultation/collaboration.  EKG Interpretation   None         Jakerra Floyd H Aspen Deterding, MD 11/29/13 1458 

## 2015-01-23 ENCOUNTER — Emergency Department: Payer: Self-pay

## 2015-01-23 ENCOUNTER — Emergency Department: Admit: 2015-01-23 | Payer: Self-pay

## 2015-01-23 ENCOUNTER — Inpatient Hospital Stay: Admit: 2015-01-23 | Discharge: 2015-01-23 | Payer: Self-pay | Attending: Emergency Medicine

## 2015-01-23 DIAGNOSIS — H8309 Labyrinthitis, unspecified ear: Secondary | ICD-10-CM

## 2015-01-23 LAB — CBC WITH AUTOMATED DIFF
ABS. BASOPHILS: 0 10*3/uL (ref 0.0–0.2)
ABS. EOSINOPHILS: 0.1 10*3/uL (ref 0.0–0.8)
ABS. IMM. GRANS.: 0 10*3/uL (ref 0.0–0.5)
ABS. LYMPHOCYTES: 2 10*3/uL (ref 0.5–4.6)
ABS. MONOCYTES: 0.5 10*3/uL (ref 0.1–1.3)
ABS. NEUTROPHILS: 5.9 10*3/uL (ref 1.7–8.2)
BASOPHILS: 0 % (ref 0.0–2.0)
EOSINOPHILS: 1 % (ref 0.5–7.8)
HCT: 43.3 % (ref 41.1–50.3)
HGB: 14.7 g/dL (ref 13.6–17.2)
IMMATURE GRANULOCYTES: 0.2 % (ref 0.0–5.0)
LYMPHOCYTES: 23 % (ref 13–44)
MCH: 29.8 PG (ref 26.1–32.9)
MCHC: 33.9 g/dL (ref 31.4–35.0)
MCV: 87.7 FL (ref 79.6–97.8)
MONOCYTES: 5 % (ref 4.0–12.0)
MPV: 10.3 FL — ABNORMAL LOW (ref 10.8–14.1)
NEUTROPHILS: 71 % (ref 43–78)
PLATELET: 336 10*3/uL (ref 150–450)
RBC: 4.94 M/uL (ref 4.23–5.67)
RDW: 12.6 % (ref 11.9–14.6)
WBC: 8.5 10*3/uL (ref 4.3–11.1)

## 2015-01-23 LAB — METABOLIC PANEL, COMPREHENSIVE
A-G Ratio: 1.3 (ref 1.2–3.5)
ALT (SGPT): 18 U/L (ref 12–65)
AST (SGOT): 10 U/L — ABNORMAL LOW (ref 15–37)
Albumin: 4.3 g/dL (ref 3.5–5.0)
Alk. phosphatase: 108 U/L (ref 50–136)
Anion gap: 7 mmol/L (ref 7–16)
BUN: 15 MG/DL (ref 6–23)
Bilirubin, total: 0.5 MG/DL (ref 0.2–1.1)
CO2: 29 mmol/L (ref 21–32)
Calcium: 8.8 MG/DL (ref 8.3–10.4)
Chloride: 101 mmol/L (ref 98–107)
Creatinine: 1.1 MG/DL (ref 0.8–1.5)
GFR est AA: >60 mL/min/{1.73_m2} (ref >60)
GFR est non-AA: >60 mL/min/{1.73_m2} (ref >60)
Globulin: 3.2 g/dL (ref 2.3–3.5)
Glucose: 209 mg/dL — ABNORMAL HIGH (ref 65–100)
Potassium: 3.7 mmol/L (ref 3.5–5.1)
Protein, total: 7.5 g/dL (ref 6.3–8.2)
Sodium: 137 mmol/L (ref 136–145)

## 2015-01-23 LAB — DRUG SCREEN, URINE
AMPHETAMINES: NEGATIVE
BARBITURATES: NEGATIVE
BENZODIAZEPINES: NEGATIVE
COCAINE: POSITIVE
METHADONE: NEGATIVE
OPIATES: NEGATIVE
PCP(PHENCYCLIDINE): NEGATIVE
THC (TH-CANNABINOL): NEGATIVE

## 2015-01-23 LAB — GLUCOSE, POC: Glucose (POC): 198 mg/dL — ABNORMAL HIGH (ref 65–100)

## 2015-01-23 LAB — MAGNESIUM: Magnesium: 2.1 mg/dL (ref 1.8–2.4)

## 2015-01-23 MED ORDER — SODIUM CHLORIDE 0.9% BOLUS IV
0.9 % | Freq: Once | INTRAVENOUS | Status: AC
Start: 2015-01-23 — End: 2015-01-23
  Administered 2015-01-23: 13:00:00 via INTRAVENOUS

## 2015-01-23 MED ORDER — MECLIZINE 25 MG TAB
25 mg | ORAL_TABLET | Freq: Three times a day (TID) | ORAL | Status: AC | PRN
Start: 2015-01-23 — End: 2015-02-02

## 2015-01-23 MED ORDER — PROMETHAZINE 25 MG/ML INJECTION
25 mg/mL | INTRAMUSCULAR | Status: AC
Start: 2015-01-23 — End: 2015-01-23
  Administered 2015-01-23: 13:00:00 via INTRAVENOUS

## 2015-01-23 MED FILL — PROMETHAZINE 25 MG/ML INJECTION: 25 mg/mL | INTRAMUSCULAR | Qty: 1

## 2015-01-23 NOTE — ED Notes (Signed)
Pt refused discharge paperwork. IV taken out. Pt refused Vital signs

## 2015-01-23 NOTE — Progress Notes (Signed)
Met with patient in room to discuss [facility]. Patient states he has health insurance([facility]).

## 2015-01-23 NOTE — ED Notes (Signed)
Patient reports chills since early this am. States "forgot" to take long acting insulin last pm. States has been having trouble managing BGL this week.

## 2015-01-23 NOTE — ED Provider Notes (Signed)
HPI Comments: Patient presents with complaint of dizziness onset this morning.  Patient is diabetic and has been having problems keeping his blood sugar under control with the occasional drops in a sugar and periodic elevations of blood sugar as well.  He woke up this morning and got out of bed and fell to the floor complains of dizziness which is further described as disequilibrium or vertigo.  He states he feels like he spinning but does not see any visual spinning at the time of the incident or currently.  Patient's symptoms do seem to get somewhat worse when standing versus lying down he denies headache he does complain of some blurry vision although is wearing contacts that he is been wearing for a week continuously now.  He denies any eye pain nausea vomiting or recent illnesses he denies alcohol or drug use.  He denies prior occurrence    Patient is a 44 y.o. male presenting with dizziness. The history is provided by the patient.   Dizziness  This is a new problem. The current episode started 1 to 2 hours ago. The problem has been gradually improving. There was no focality noted. Primary symptoms include loss of balance.Pertinent negatives include no focal weakness, no loss of sensation, no slurred speech, no speech difficulty, no memory loss, no movement disorder, no agitation, no visual change, no auditory change, no mental status change, no unresponsiveness and no disorientation. There has been no fever. The fever has been present for less than 1 day. Pertinent negatives include no shortness of breath, no chest pain, no vomiting, no altered mental status, no confusion, no headaches, no choking, no nausea, no bowel incontinence and no bladder incontinence. There were no medications administered prior to arrival. Associated medical issues do not include trauma.        Past Medical History:   Diagnosis Date   ??? Diabetes (HCC)        History reviewed. No pertinent past surgical history.       History reviewed. No pertinent family history.    History     Social History   ??? Marital Status: SINGLE     Spouse Name: N/A     Number of Children: N/A   ??? Years of Education: N/A     Occupational History   ??? Not on file.     Social History Main Topics   ??? Smoking status: Current Every Day Smoker   ??? Smokeless tobacco: Not on file   ??? Alcohol Use: No   ??? Drug Use: No   ??? Sexual Activity: Not on file     Other Topics Concern   ??? Not on file     Social History Narrative   ??? No narrative on file           ALLERGIES: Review of patient's allergies indicates no known allergies.      Review of Systems   Respiratory: Negative for choking and shortness of breath.    Cardiovascular: Negative for chest pain.   Gastrointestinal: Negative for nausea, vomiting and bowel incontinence.   Genitourinary: Negative for bladder incontinence.   Neurological: Positive for dizziness and loss of balance. Negative for focal weakness, speech difficulty and headaches.   Psychiatric/Behavioral: Negative for memory loss, confusion and agitation.   All other systems reviewed and are negative.      Filed Vitals:    01/23/15 0654   BP: 121/66   Pulse: 96   Temp: 98.2 ??F (36.8 ??C)   Resp:  16   Height: 5\' 7"  (1.702 m)   Weight: 54.432 kg (120 lb)   SpO2: 95%            Physical Exam   Constitutional: He is oriented to person, place, and time. He appears well-developed and well-nourished. No distress.   HENT:   Head: Normocephalic and atraumatic.   Mouth/Throat: No oropharyngeal exudate.   Eyes: Conjunctivae and EOM are normal. Pupils are equal, round, and reactive to light. No scleral icterus.   No nystagmus is noted   Neck: Normal range of motion. Neck supple.   Cardiovascular: Normal rate, regular rhythm, normal heart sounds and intact distal pulses.    Pulmonary/Chest: Effort normal and breath sounds normal.   Abdominal: Soft. Bowel sounds are normal.   Musculoskeletal: Normal range of motion. He exhibits no edema or tenderness.    Neurological: He is alert and oriented to person, place, and time. He has normal reflexes. He displays normal reflexes. No cranial nerve deficit. He exhibits normal muscle tone. Coordination abnormal.   Patient does demonstrate some disequilibrium as well as ataxia on exam   Skin: Skin is warm and dry.   Psychiatric: He has a normal mood and affect. His behavior is normal.   Nursing note and vitals reviewed.       MDM  Number of Diagnoses or Management Options     Amount and/or Complexity of Data Reviewed  Clinical lab tests: ordered and reviewed  Tests in the radiology section of CPT??: ordered and reviewed  Review and summarize past medical records: yes  Discuss the patient with other providers: yes  Independent visualization of images, tracings, or specimens: yes    Risk of Complications, Morbidity, and/or Mortality  Presenting problems: high  Diagnostic procedures: high  Management options: moderate    Patient Progress  Patient progress: stable      Procedures

## 2015-01-23 NOTE — ED Notes (Signed)
POC BGL 198

## 2015-01-23 NOTE — ED Notes (Signed)
Report from Katie RN. Care transferred at this time.

## 2015-01-23 NOTE — ED Notes (Signed)
Patient arrives by EMS, patient felt dizzy thought his blood sugar was low. Per EMS BGL 222.

## 2015-01-23 NOTE — ED Notes (Signed)
Patient reports feeling better. States still feels drowsy.

## 2015-04-06 NOTE — Consult Note (Signed)
PATIENT NAME:  Erik Ray, Ohn S MR#:  [id number] DATE OF BIRTH:  [birth year]  DATE OF CONSULTATION:  06/25/2013  REFERRING PHYSICIAN:   CONSULTING PHYSICIAN:  Rosann Gorum K. Guss Bundehalla, MD  PLACE OF DICTATION:  [facility] [place]   SEX:  Male.  RACE:  White.  AGE:  44 years.  PLACE SEEN:  Room #207.  SUBJECTIVE:  The patient is a 44 year old white male who is employed as a Medical illustratorsalesman and is single, never married and has no place to sleep and calls himself as homeless at times and is in between places.  The [facility] comes [facility] with a chief complaint "I overdosed myself on cocaine."  HISTORY OF PRESENT ILLNESS:  The patient reports that he has been feeling stressed out and he did not know what to do and how to cope and so he overdosed on cocaine and realized he needed help and came to [facility].  The patient was asked to be seen in consultation by the undersigned.    PAST PSYCHIATRIC HISTORY:  No previous history of inpatient psychiatry.  No history of suicide attempt.  Not being followed by any psychiatrist at this time.    ALCOHOL AND DRUGS:  Admits that he drinks alcohol on occasional basis.  Admits that he smokes cocaine once every 2 months and not on a regular basis.  THC denied.  Denies opioid usage.  Denies IV drugs.    MENTAL STATUS EXAMINATION:  The patient is alert and oriented, calm, pleasant and cooperative.  No agitation.   mood is stable.  Denies feeling depressed.  Admits that he felt really stressed out and he did not know what to do and so he overdosed himself on cocaine to cope with his stressors of life.  No psychosis.  Denies auditory or visual hallucinations.  Denies hearing voices or anything.  Denies paranoid or suspicious ideas.  Denies any grandiose ideas.  Memory is intact.  Cognition is intact.  General knowledge of information fair for level of education.  Denies suicidal or homicidal idea or plan and contracts for safety.    IMPRESSION:  Acute  stress disorder, cocaine overdose, cocaine abuse.    RECOMMENDATIONS:  Discharge the patient when he is medically cleared and stable.  Continue current medications and give him enough until he has next appointment with his physicians.  The patient is to call [facility] for follow-up and help so that he can get help for his coping skills when he feels stressed out.      ____________________________ Jannet MantisSurya K. Guss Bundehalla, MD skc:ea D: 06/25/2013 17:42:18 ET T: 06/25/2013 23:15:09 ET JOB#: [id number]  cc: Monika SalkSurya K. Guss Bundehalla, MD, <Dictator> Beau FannySURYA K Keymiah Lyles MD ELECTRONICALLY SIGNED 06/26/2013 19:14

## 2015-04-06 NOTE — Consult Note (Signed)
Brief Consult Note: Diagnosis: cocaine dependence.   Patient was seen by consultant.   Consult note dictated.   Orders entered.   Comments: Psychiatry: Patient with long history cocaine dependece relapsed with near fatal results. He is grumpy and feels bad. Says he is depressed but denies intent to harm himself. He cut the interview short and is not in the frame of mind to discuss a recovery plan now. I will do full note and I suggest you ask the weekend consult person to check in with him again over the weekend to see if he is more receptive to input.  Electronic Signatures: Audery Amellapacs, John T (MD)  (Signed 11-Jul-14 18:17)  Authored: Brief Consult Note   Last Updated: 11-Jul-14 18:17 by Audery Amellapacs, John T (MD)

## 2015-04-06 NOTE — Discharge Summary (Signed)
PATIENT NAME:  Erik Ray, Erik Ray MR#:  [id number] DATE OF BIRTH:  [birth year]  DATE OF ADMISSION:  06/24/2013 DATE OF DISCHARGE:  06/25/2013  ADMITTING DIAGNOSES: Acute encephalopathy, likely due to cocaine as well as marijuana.   DISCHARGE DIAGNOSES:  1.  Rhabdomyelitis. 2.  Cocaine, tobacco dependence. 3.  Encephalopathy, due to drugs. 4.  History of mood disorder, not specified. 5.  History of nicotine abuse.  6.  Diabetes mellitus, type 1.   DISCHARGE CONDITION: Stable.   DISCHARGE MEDICATIONS: The patient is to resume his Novolog, also insulin, Glargine,  14 units subcutaneously at bedtime.   DIET: 2 grams salt, low fat, low cholesterol, carbohydrate-controlled diet, regular consistency.   ACTIVITY LIMITATIONS: As tolerated.   FOLLOWUP APPOINTMENTS: With [facility] in two days after discharge.   CONSULTANTS: Dr. Toni Amendlapacs, Dr. Guss Bundehalla; social work.   RADIOLOGIC STUDIES: Chest, portable, single-view, 11th of July 2014 showed no evidence of acute cardiopulmonary disease.   CT scan of head done without contrast visualized 11th of July 2014 revealed no acute intracranial process.   HOSPITAL COURSE: The patient is a 44 year old Caucasian male with a past medical history significant for a history of diabetes mellitus type 1, insulin-dependent, who presented to the hospital after he was found to be wandering around a gas station and was confused. He was noted to be in acute renal failure and had rhabdomyelitis.   Please refer to Dr. Serita GritShreyang Patel'Ray admission note on the 11th of July 2014. On arrival to the Emergency Room, the patient'Ray temperature was 97.7, pulse was 84, respiratory rate was 18, blood pressure 87/52.   Physical exam was unremarkable. The patient was somewhat sleepy, however whenever he was stimulated he was awake and followed commands. He was able to converse. His EKG showed sinus tachy with no significant changes.   Patient'Ray lab data done in the on the 11th of  July 2014 revealed elevated BUN and creatinine of  28 and 1.37, glucose 314, otherwise BMP was unremarkable.   The patient'Ray liver enzymes revealed elevation of AST to 62, however the patient'Ray CK total was elevated to 1933. MB fraction was elevated at 17.6, and troponin was less than 0.02.   Urine drug screen was positive for cocaine as well as cannabinoids, however the white blood cell count was normal at 10.4, hemoglobin was 15.0, platelet count 297.   Urinalysis revealed yellow, hazy urine, more than 500 glucose, negative for bilirubin,  2+ ketones, specific gravity 1.028. PH was 6.0, 1+ blood, 100 mg/dL of protein, negative for nitrites or leukocyte esterase, 11 red blood cells as well as 4 white blood cells. No bacteria or epithelial cells were seen. Mucus was present as well as hyaline casts.   The patient was admitted to the hospital for further evaluation. He was rehydrated as it was felt that the patient'Ray elevated BUN and creatinine as well as hypertension were related to dehydration. With rehydration the patient'Ray BUN and creatinine normalized. On the 11th of July normalized at 8:00 a.m. the patient'Ray BUN and creatinine were 23 and 1.08, which further improved. As time progressed otherwise, the patient'Ray BMP was unremarkable. It was felt that the patient'Ray elevated AST was due to CK total elevation. The patient was given IV fluids, and the patient'Ray CK total improved. By the 12th of July 2014 the patient'Ray CK total was 330. The patient was able to eat and drink, and it was felt that he was stable to be discharged home.   Due to  use of drugs consultation with Dr. Toni Amend as well as Dr. Guss Bunde were obtained.  Dr. Toni Amend felt the patient had cocaine dependence, non-specified mood disorder, marijuana abuse, nonspecific traits, as well as homelessness and stress of relapse. He however was felt to be not appropriate for admission to the behavioral unit.   On the day of discharge, the patient'Ray  vital signs: Temperature was 98.3, pulse was 60s to 70s, respiratory rate was 18, blood pressure 106/67, saturation was 98% on room air at rest.   The patient is being discharged in stable condition with the above-mentioned medications and followup. He was advised to drink plenty of fluids. In regards to diabetes mellitus he was advised to continue his insulin, Lantus, and a prescription was given to him. He is to also continue sliding-scale insulin.   Time spent: 40 minutes.      ____________________________ Katharina Caper, MD rv:dm D: 06/26/2013 07:06:08 ET T: 06/26/2013 07:48:36 ET JOB#: [id number]  cc: Katharina Caper, MD, <Dictator> Sion Reinders MD ELECTRONICALLY SIGNED 06/29/2013 9:00

## 2015-04-06 NOTE — Consult Note (Signed)
PATIENT NAME:  Erik Ray, Erik Ray MR#:  [id number] DATE OF BIRTH:  Apr 28, 1971  DATE OF CONSULTATION:  06/24/2013  REFERRING PHYSICIAN:   CONSULTING PHYSICIAN:  Audery Amel, MD  IDENTIFYING INFORMATION AND REASON FOR CONSULTATION:  A 44 year old man brought into the hospital, confused, delirious, having rhabdomyolysis.  Consultation for cocaine dependence.   HISTORY OF PRESENT ILLNESS:  Information was obtained from the patient and the chart and also from a side conversation with the patient's stepmother.  The patient was found wandering around outside a local gas station, confused, appearing to be delirious.  He was brought to the hospital and found to be having rhabdomyolysis.  He has a drug screen positive for cocaine.  On interview today, the patient tells me that he relapsed into crack use and has been using heavily for the last few days.  He cannot remember all the specifics of it.  Denied that he has been using other drugs during that time.  He tells me that he is feeling extremely bad right now both physically and mentally.  He describes himself as feeling depressed.  He denies that he is having any thoughts of actually doing anything to specifically harm himself.  He does not report any psychotic symptoms.  The patient was only minimally cooperative with the interview.  He was clearly feeling very upset and terminated the interview prematurely.  His stepmother came to speak with me afterwards and told me that he was feeling a great deal of despair because he had relapsed and messed up his life after having tried to maintain some sobriety recently.  She is sure he is feeling a great deal of shame.  He expresses a lot of hopelessness.  Does not seem to feel that any kind of treatment could possibly be helpful for him.   PAST PSYCHIATRIC HISTORY:  The patient has a history of cocaine dependence that has resulted in extreme situations like this one before.  This is not his first time coming into the  hospital in some extreme medical condition related to his cocaine dependence.  The patient does have insulin-dependent diabetes.   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  This arrangement had been conditional on his maintaining sobriety.  Now that he has relapsed his father is not willing to have him come back home or work with him again.  This has resulted in at least temporary homelessness for the patient.   SUBSTANCE ABUSE HISTORY:  Long history of cocaine dependence.  This has been his primary drug of abuse.  Does not typically abuse many other drugs.  He has been referred to [facility] and referred to other treatment centers.  He speaks of all of these dismissive as though the fact that he has not been able to stay permanently sober means that they will not work for him that he has no use for the sort of treatment any more.   REVIEW OF SYSTEMS:  He is complaining of feeling depressed, tired, achy all over, hopeless, helpless.  Denies hallucinations.  Denied acute suicidal intent.   MENTAL STATUS EXAMINATION:  The patient is in hospital garb, reasonably well-groomed, sitting up on his bed.  He was only partially cooperative with the interview and terminated it early saying that he did not feel like he could talk about it anymore because it was making him feel more depressed.  Eye contact intermittent.  Psychomotor activity constrained.  Speech decreased in  total amount.  Affect was irritable and dysphoric.  Mood was stated as depressed.  Thoughts were lucid.  No obvious psychosis or delusions.  Denies hallucinations.  Denies acute suicidal intent or plan, although he feels hopeless.  Intelligence normal.  Judgment and insight poor.  Alert and oriented.   LABORATORY RESULTS:  Admission labs show him to have been tachycardic, glucose elevated.  Head CT completely unremarkable.  Drug screen positive for cocaine and also cannabis.  Total CK was  initially 1933.  Chemistry panel shows creatinine elevated at 1.37, BUN elevated at 28, glucose 314, AST elevated at 62.  CBC normal.  Urinalysis strongly positive for glucose and has some red blood cells.  Hemoglobin A1c elevated.  His creatinine seemed to be starting to come down.   ASSESSMENT:  A 44 year old man with cocaine dependence.  Right now he presents as being irritable and grumpy and dismissive.  The patient clearly is feeling ashamed of himself, depressed and hopeless.  He appears to unfortunately put little or no stock in the possibility of treatment being helpful for him.  He has engaged only partially with the available treatment and is acting dismissive of it.  He is taking a hopeless stance which puts him at high risk of relapse and continued use.  He appears to be unwilling to hear about or discuss the many options for treatment or to engage in any kind of positive discussion.  If he is discharged in his current condition the chances of continued abuse with serious consequences are high.  Right now he is not receptive to discussing treatment.  Hopefully, once he gets some rest and feels a little better, he will be more responsive to it.  I would hope that we could have a discussion about treatment that is available and try and convince him that giving up is his worst option right now and try and seek some kind of better treatment option for him.  I will not be here over the weekend.  If it looks like he is going to be discharged or needs to be re-evaluated sooner, please contact the psychiatrist on call.  If he is still in the hospital on Monday, I will come by and try and talk with him again.  No specific medication psychiatrically indicated right now, although if he needs medicine to help with sleep, I do not think there is any problem with doing that in the short-term given how uncomfortable he is.   DIAGNOSIS, PRINCIPAL AND PRIMARY:  AXIS I:  Cocaine dependence.   SECONDARY  DIAGNOSES: AXIS I:  (Dictation Anomaly) substance induced mood disorder, marijuana abuse.  AXIS II:  Narcissistic traits.  AXIS III:  Insulin-dependent diabetes, rhabdomyolysis.  AXIS IV:  Severe from homelessness, stress of relapse.  AXIS V:  Functioning at time of evaluation 30.    ____________________________ Audery AmelJohn T. Arif Amendola, MD jtc:ea D: 06/25/2013 00:53:52 ET T: 06/25/2013 06:33:21 ET JOB#: [id number]  cc: Audery AmelJohn T. Allena Pietila, MD, <Dictator> Audery AmelJOHN T Mikhala Kenan MD ELECTRONICALLY SIGNED 06/27/2013 11:21

## 2015-04-06 NOTE — H&P (Signed)
PATIENT NAME:  Erik Ray, Erik Ray MR#:  [id number] DATE OF BIRTH:  10/23/1971  DATE OF ADMISSION:  06/24/2013  REFERRING PHYSICIAN:  Malachy Moanevainder Goli, MD  PRIMARY CARE PROVIDER: None.   CHIEF COMPLAINT: [place]. Also has rhabdomyolysis and some acute renal failure.  HISTORY OF PRESENT ILLNESS: The patient is a 44 year old Caucasian male with history of substance abuse, is a type I diabetic who was found outside [place] confused, walking around. Therefore, EMS was called. He was brought to the ED.  In the ER, the patient started becoming very lethargic. He had a CT scan of the head, which was negative. The patient was monitored throughout the night. He also was noted to have rhabdomyolysis and acute renal failure so he was given IV hydration. With that his CPK has improved, but the patient is still a little lethargic, but he is able to wake up and answer questions and interact with me. He denies any chest pain, shortness of breath, abdominal pain, nausea, vomiting or diarrhea.   PAST MEDICAL HISTORY: Type 1 diabetes.   PAST SURGICAL HISTORY: None.  ALLERGIES: No known drug allergies.   MEDICATIONS: He is on Humalog sliding scale and Lantus 14 units at bedtime.   SOCIAL HISTORY: Smokes 1 pack per day. Denies any alcohol. Uses cocaine and marijuana.   FAMILY HISTORY: Positive for hypertension.  REVIEW OF SYSTEMS: CONSTITUTIONAL: Denies any fevers. Complains of fatigue, weakness. No pain, no weight loss or weight gain.  EYES: No blurred or double vision. No pain, no redness, no inflammation, no glaucoma and no cataracts.  ENT: No tinnitus, no ear pain, no hearing loss, no seasonal or year-round allergies, no epistaxis, no nasal discharge and no difficulty swallowing.  RESPIRATORY: Denies any cough, wheezing, hemoptysis. No COPD.  No TB.  CARDIOVASCULAR: Denies any chest pain, orthopnea, edema or arrhythmia. No palpitations.   GASTROINTESTINAL: No nausea, vomiting, diarrhea. No abdominal pain. No hematemesis. No melena. No ulcer.  GENITOURINARY: Denies any dysuria, hematuria, renal calculus or frequency.  ENDOCRINE: Denies any polyuria, nocturia or thyroid problems.  HEMATOLOGIC/LYMPHATIC: Denies anemia, easy bruisability or bleeding.  SKIN: No acne. No rash or lesions.  MUSCULOSKELETAL: Denies any pain in the neck, back or shoulder. No gout.  NEUROLOGIC: CVA. No TIA.  No seizures.  PSYCHIATRIC: No anxiety, no insomnia and no ADD.   PHYSICAL EXAMINATION: VITAL SIGNS: Temperature 97.7, pulse 84, respirations 18 and blood pressure is 87/52.  GENERAL: The patient is a well-developed, muscular Caucasian male who appears younger than his age.  Currently is drowsy but awakes and answers questions and then falls back asleep.  HEENT: Head atraumatic, normocephalic. Pupils equally round and reactive to light and accommodation. There is no conjunctival pallor. No scleral icterus. Nasal exam shows no drainage or ulceration. Oropharynx is clear without any exudate.  NECK: No thyromegaly. No carotid bruits.  HEART:  Regular rate and rhythm. No murmurs, rubs, clicks or gallops. PMI is not displaced.  LUNGS: Clear to auscultation bilaterally without any rales, rhonchi or wheezing.  ABDOMEN: Soft, nontender and nondistended. Positive bowel sounds x 4.  EXTREMITIES: No clubbing, cyanosis or edema.  SKIN: No rash.  LYMPHATICS: No lymph nodes palpable.  VASCULAR: Good DP and PT pulses.  PSYCHIATRIC: Currently a little lethargic, but not anxious or depressed.  NEUROLOGIC:  The patient is still sleepy, but awakes and follows commands.  Spontaneously moving all extremities. Able to converse.  LYMPHATICS: No lymph nodes palpable.  LABORATORY AND DIAGNOSTICS: In the ED:  His glucose was 189, BUN 23 and creatinine 1.08; it was 1.37 during the morning.  Sodium 142, potassium 3.8, chloride 109, CO2 27. His calcium was 8.0. LFTs were  normal, except slightly elevated AST. His CPK was 1933 and is decreased to 1209. His TUDS were positive for cocaine and THC. WBC 10.4, hemoglobin 15 and platelet count 297. Urinalysis was negative.   EKG showed sinus tachycardia. CT scan of the head showed no acute abnormality. Chest x-ray showed no acute abnormality.   ASSESSMENT AND PLAN: The patient is a 44 year old white male with polysubstance abuse and is a type I diabetic who is brought in with altered mental status likely due to cocaine and marijuana use.  1.  Acute encephalopathy.  Likely due to cocaine and marijuana. At this time, his mental status is slowly starting to improve as the drugs get out of his system. At this time, we will do neuro checks.  2.  Rhabdomyolysis.  Likely due to cocaine. We will continue aggressive IV fluids and follow CPK in the morning.  3.  Acute renal failure. Improved with IV hydration.  4.  Diabetes, type I.  On Lantus and Humalog. We will go ahead and restart his Lantus.  5.  Nicotine addiction. The patient counseled for nicotine addiction for 4 minutes. The patient recommended to stop smoking.  Offered nicotine patch while in the hospital.  6.  Polysubstance abuse. The patient counseled regarding stopping of using drugs and 4 minutes spent on that. The patient will also get substance abuse counseling during the hospitalization.  TIME SPENT ON ADMISSION: 45 minutes. ____________________________ Lacie Scotts Allena Katz, MD shp:sb D: 06/24/2013 09:20:00 ET T: 06/24/2013 09:54:01 ET JOB#: [id number]  cc: Cobin Cadavid H. Allena Katz, MD, <Dictator> Charise Carwin MD ELECTRONICALLY SIGNED 06/28/2013 11:04

## 2016-08-18 ENCOUNTER — Emergency Department (HOSPITAL_COMMUNITY)
Admission: EM | Admit: 2016-08-18 | Discharge: 2016-08-18 | Disposition: A | Discharge status: Home / Self Care | Payer: BLUE CROSS/BLUE SHIELD | Attending: Emergency Medicine | Admitting: Emergency Medicine

## 2016-08-18 ENCOUNTER — Encounter (HOSPITAL_COMMUNITY): Payer: Self-pay

## 2016-08-18 DIAGNOSIS — Z5321 Procedure and treatment not carried out due to patient leaving prior to being seen by health care provider: Secondary | ICD-10-CM | POA: Diagnosis not present

## 2016-08-18 DIAGNOSIS — E1165 Type 2 diabetes mellitus with hyperglycemia: Secondary | ICD-10-CM | POA: Insufficient documentation

## 2016-08-18 DIAGNOSIS — F17210 Nicotine dependence, cigarettes, uncomplicated: Secondary | ICD-10-CM | POA: Insufficient documentation

## 2016-08-18 DIAGNOSIS — Z794 Long term (current) use of insulin: Secondary | ICD-10-CM | POA: Diagnosis not present

## 2016-08-18 LAB — BASIC METABOLIC PANEL
Anion gap: 11 (ref 5–15)
BUN: 20 mg/dL (ref 6–20)
CHLORIDE: 101 mmol/L (ref 101–111)
CO2: 25 mmol/L (ref 22–32)
Calcium: 9.6 mg/dL (ref 8.9–10.3)
Creatinine, Ser: 1.19 mg/dL (ref 0.61–1.24)
GFR calc Af Amer: >60 mL/min (ref >60)
GFR calc non Af Amer: >60 mL/min (ref >60)
Glucose, Bld: 315 mg/dL — ABNORMAL HIGH (ref 65–99)
Potassium: 4.2 mmol/L (ref 3.5–5.1)
SODIUM: 137 mmol/L (ref 135–145)

## 2016-08-18 LAB — CBC
HEMATOCRIT: 46.5 % (ref 39.0–52.0)
HEMOGLOBIN: 15.9 g/dL (ref 13.0–17.0)
MCH: 30.9 pg (ref 26.0–34.0)
MCHC: 34.2 g/dL (ref 30.0–36.0)
MCV: 90.3 fL (ref 78.0–100.0)
Platelets: 332 10*3/uL (ref 150–400)
RBC: 5.15 MIL/uL (ref 4.22–5.81)
RDW: 12.4 % (ref 11.5–15.5)
WBC: 8.4 10*3/uL (ref 4.0–10.5)

## 2016-08-18 LAB — CBG MONITORING, ED: Glucose-Capillary: 303 mg/dL — ABNORMAL HIGH (ref 65–99)

## 2016-08-18 NOTE — ED Triage Notes (Signed)
Patient complains of increased thirst, frequent urination and no insulin use x 1 month. States that he is a drug user and not compliant with diabetes. Used crack pta. Alert and oriented on arrival, 20 lb weight loss the past month.

## 2016-08-18 NOTE — ED Notes (Signed)
Checked patient blood sugar it was 303 notified RN Jamie of blood sugar

## 2016-08-18 NOTE — ED Notes (Signed)
Pt came to desk stating he wanted to leave and no longer be seen. RN discussed risks and benefits with the patient and told him he did already have a room in the back where he could be seen however, patient continues to refuse. Aware of risks and benefits and states he still wants to leave. Repeat V/S obtained. Pt told to return for any new or worsening symptoms. Pt ambulatory out door and in NAD

## 2021-09-16 ENCOUNTER — Ambulatory Visit
Admission: RE | Admit: 2021-09-16 | Discharge: 2021-09-16 | Disposition: A | Discharge status: Home / Self Care | Payer: No Typology Code available for payment source | Source: Ambulatory Visit | Attending: *Deleted | Admitting: *Deleted

## 2021-09-16 ENCOUNTER — Other Ambulatory Visit: Payer: Self-pay | Admitting: *Deleted

## 2021-09-16 DIAGNOSIS — R7611 Nonspecific reaction to tuberculin skin test without active tuberculosis: Secondary | ICD-10-CM

## 2022-08-08 IMAGING — DX DG CHEST 2V
2 series · 2 of 2 positions shown · non-contrast
Comparison: Chest x-ray 07/14/2013.

CLINICAL DATA: 50-year-old male with history of positive PPD.

EXAM:
CHEST - 2 VIEW

[dg chest 2 view (1 of 2)]
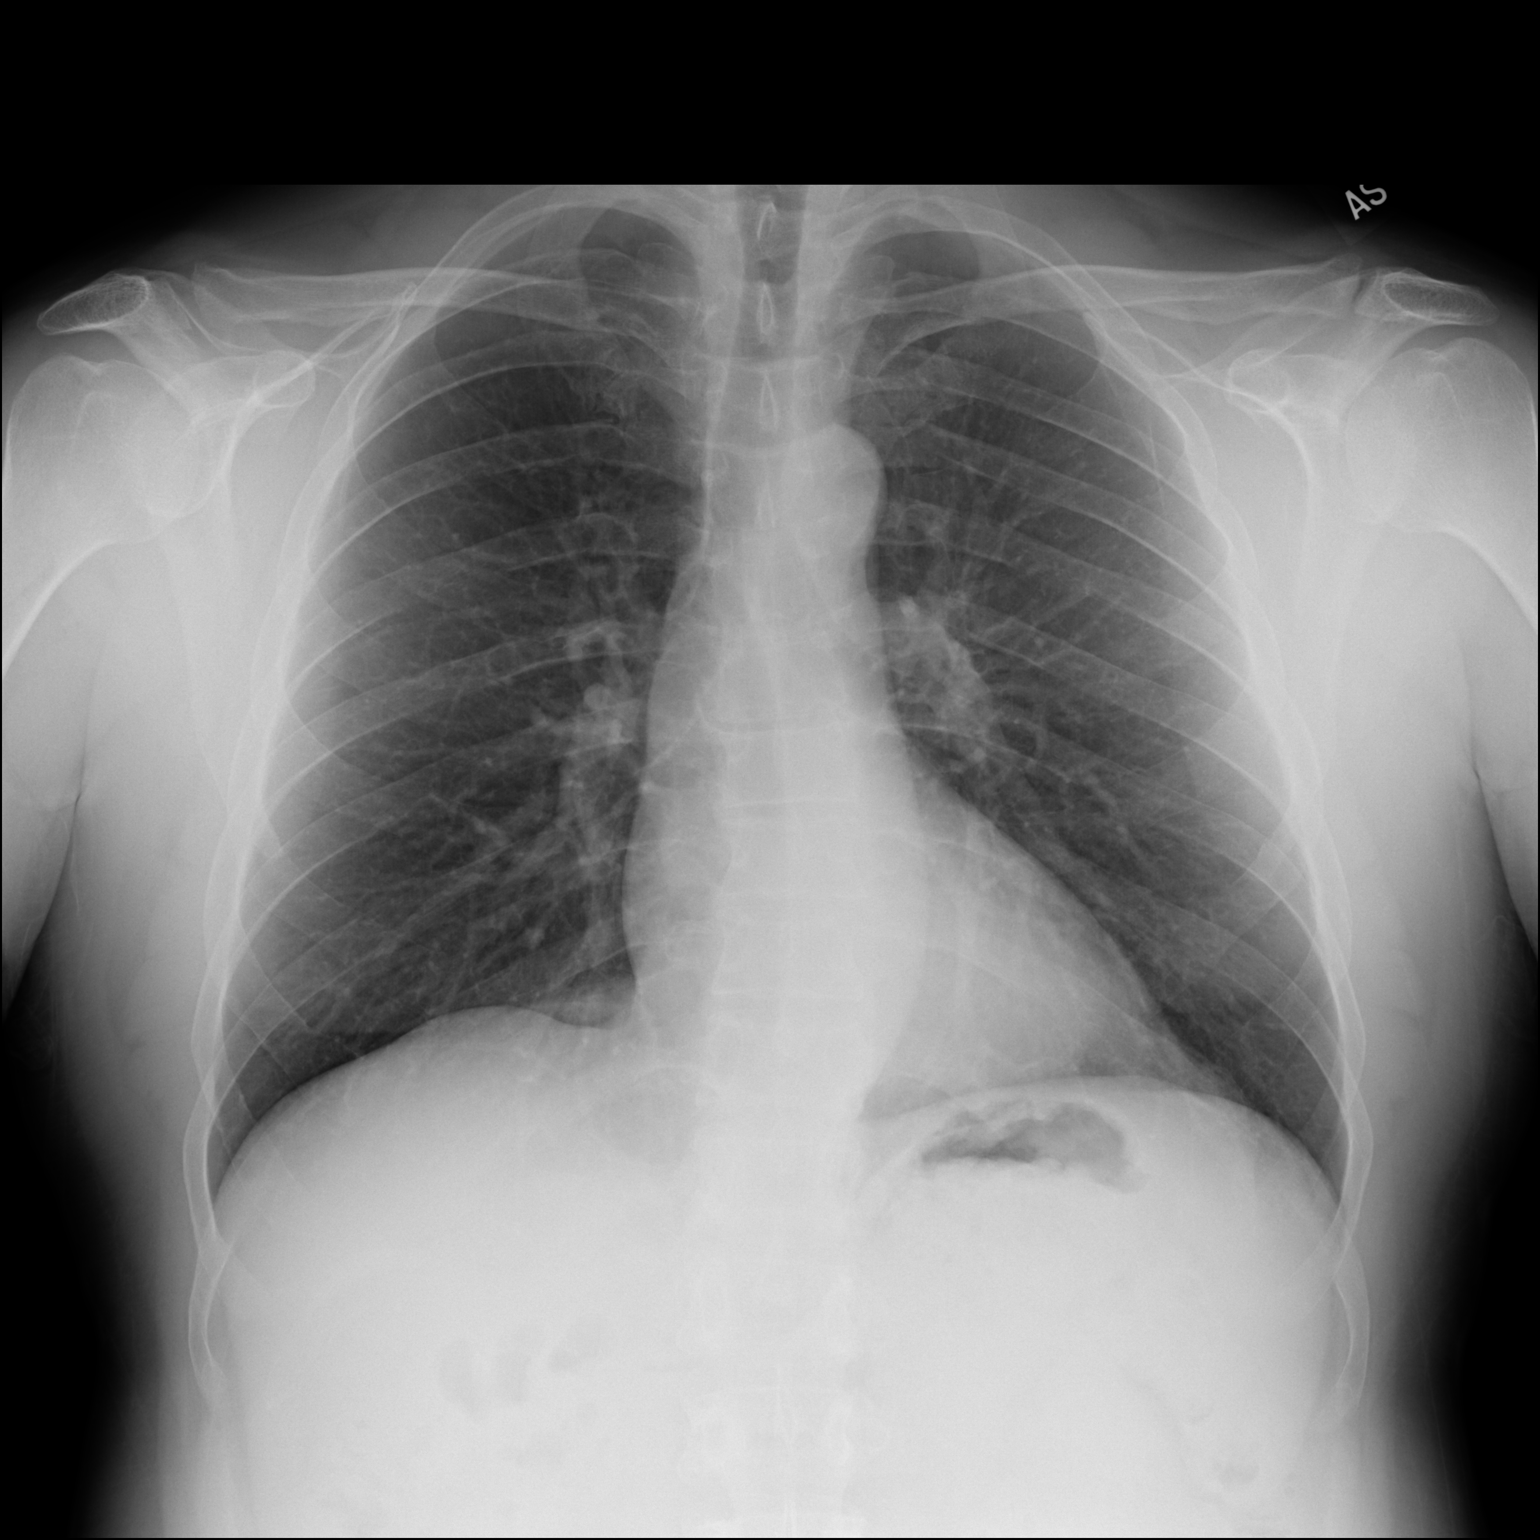

[dg chest 2 view (2 of 2)]
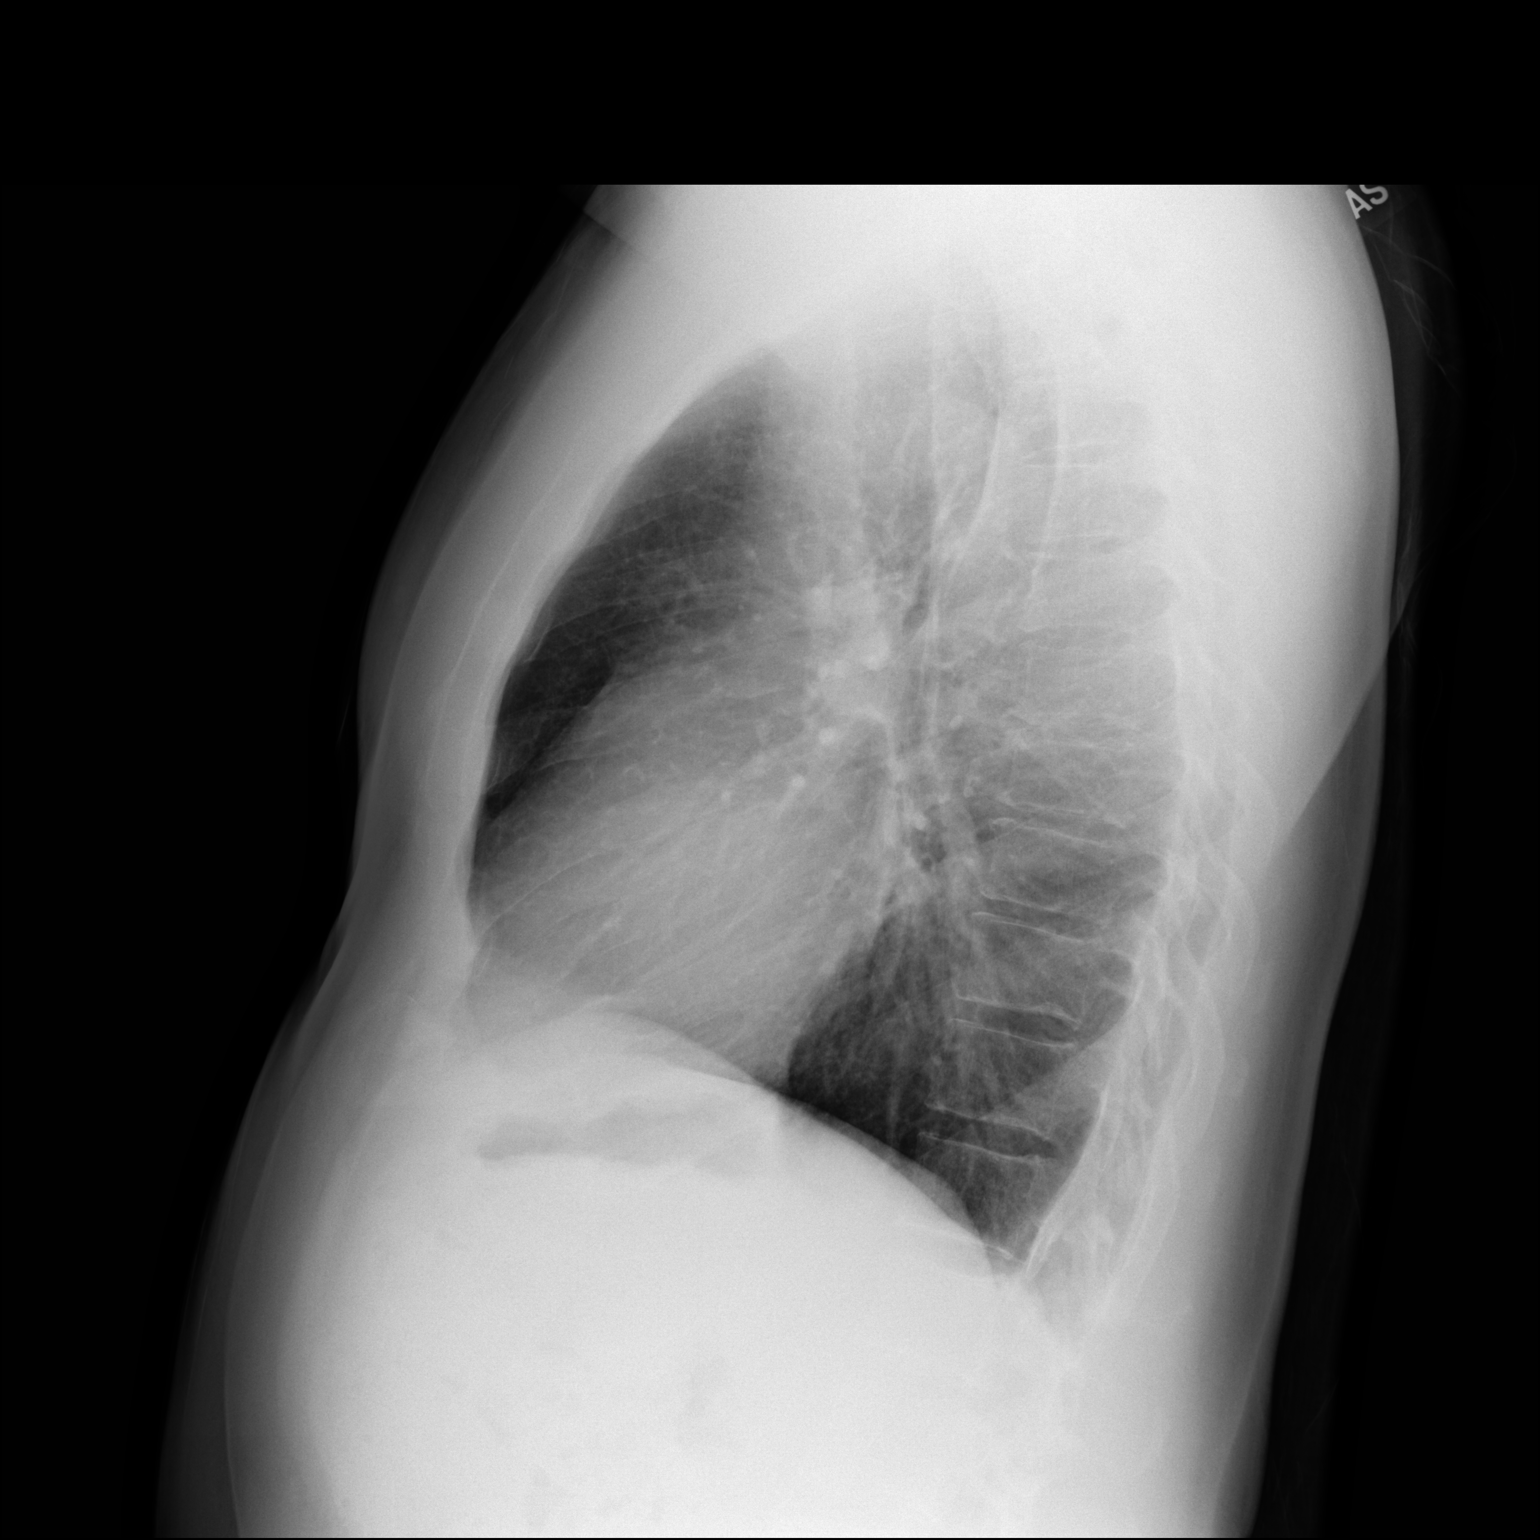

[2 of 2 positions shown; findings below may reference images not displayed]

FINDINGS: Lung volumes are normal. No consolidative airspace disease. No
pleural effusions. No pneumothorax. No pulmonary nodule or mass
noted. Pulmonary vasculature and the cardiomediastinal silhouette
are within normal limits.
IMPRESSION: No definite radiographic evidence to suggest active intrathoracic
tuberculosis.
